# Patient Record
Sex: Male | Born: 1970
Health system: Southern US, Community
[De-identification: ages and names within clinical notes are randomized; demographics above are authoritative.]

## PROBLEM LIST (undated history)

## (undated) DIAGNOSIS — F329 Major depressive disorder, single episode, unspecified: Secondary | ICD-10-CM

## (undated) DIAGNOSIS — F32A Depression, unspecified: Secondary | ICD-10-CM

## (undated) DIAGNOSIS — Z98811 Dental restoration status: Secondary | ICD-10-CM

## (undated) DIAGNOSIS — I10 Essential (primary) hypertension: Secondary | ICD-10-CM

## (undated) DIAGNOSIS — M1990 Unspecified osteoarthritis, unspecified site: Secondary | ICD-10-CM

## (undated) DIAGNOSIS — T8859XA Other complications of anesthesia, initial encounter: Secondary | ICD-10-CM

## (undated) DIAGNOSIS — A4902 Methicillin resistant Staphylococcus aureus infection, unspecified site: Secondary | ICD-10-CM

## (undated) DIAGNOSIS — R0602 Shortness of breath: Secondary | ICD-10-CM

## (undated) DIAGNOSIS — E7800 Pure hypercholesterolemia, unspecified: Secondary | ICD-10-CM

## (undated) DIAGNOSIS — T4145XA Adverse effect of unspecified anesthetic, initial encounter: Secondary | ICD-10-CM

## (undated) DIAGNOSIS — F419 Anxiety disorder, unspecified: Secondary | ICD-10-CM

## (undated) DIAGNOSIS — G4730 Sleep apnea, unspecified: Secondary | ICD-10-CM

## (undated) HISTORY — PX: NO PAST SURGERIES: SHX2092

---
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
## 2003-06-05 ENCOUNTER — Encounter: Admission: RE | Admit: 2003-06-05 | Discharge: 2003-06-05 | Payer: Self-pay | Admitting: Internal Medicine

## 2006-03-23 ENCOUNTER — Emergency Department: Payer: Self-pay | Admitting: Emergency Medicine

## 2008-06-08 DIAGNOSIS — I10 Essential (primary) hypertension: Secondary | ICD-10-CM | POA: Insufficient documentation

## 2008-06-08 DIAGNOSIS — J449 Chronic obstructive pulmonary disease, unspecified: Secondary | ICD-10-CM | POA: Insufficient documentation

## 2011-05-30 ENCOUNTER — Other Ambulatory Visit: Payer: Self-pay | Admitting: Internal Medicine

## 2011-05-30 DIAGNOSIS — E230 Hypopituitarism: Secondary | ICD-10-CM

## 2011-06-11 ENCOUNTER — Ambulatory Visit
Admission: RE | Admit: 2011-06-11 | Discharge: 2011-06-11 | Disposition: A | Discharge status: Home / Self Care | Payer: 59 | Source: Ambulatory Visit | Attending: Internal Medicine | Admitting: Internal Medicine

## 2011-06-11 DIAGNOSIS — E230 Hypopituitarism: Secondary | ICD-10-CM

## 2011-06-11 MED ORDER — GADOBENATE DIMEGLUMINE 529 MG/ML IV SOLN
10.0000 mL | Freq: Once | INTRAVENOUS | Status: AC | PRN
  Administered 2011-06-11: 10 mL via INTRAVENOUS

## 2013-06-16 ENCOUNTER — Other Ambulatory Visit: Payer: Self-pay | Admitting: Internal Medicine

## 2013-06-16 ENCOUNTER — Encounter (HOSPITAL_COMMUNITY): Payer: Self-pay | Admitting: Emergency Medicine

## 2013-06-16 DIAGNOSIS — Z79899 Other long term (current) drug therapy: Secondary | ICD-10-CM | POA: Insufficient documentation

## 2013-06-16 DIAGNOSIS — Z791 Long term (current) use of non-steroidal anti-inflammatories (NSAID): Secondary | ICD-10-CM | POA: Insufficient documentation

## 2013-06-16 DIAGNOSIS — K5900 Constipation, unspecified: Secondary | ICD-10-CM | POA: Insufficient documentation

## 2013-06-16 DIAGNOSIS — R109 Unspecified abdominal pain: Secondary | ICD-10-CM

## 2013-06-16 DIAGNOSIS — J209 Acute bronchitis, unspecified: Secondary | ICD-10-CM | POA: Insufficient documentation

## 2013-06-16 DIAGNOSIS — R140 Abdominal distension (gaseous): Secondary | ICD-10-CM

## 2013-06-16 NOTE — ED Notes (Signed)
Patient arrives with complaint of inability to have a bowel movement for about a week. Has seen PCP and has CT scan ordered for next week. Patient explains that abdominal pain has been increasing and he is worried something more may be wrong.

## 2013-06-17 ENCOUNTER — Encounter (HOSPITAL_COMMUNITY): Payer: Self-pay | Admitting: Radiology

## 2013-06-17 ENCOUNTER — Emergency Department (HOSPITAL_COMMUNITY)
Admission: EM | Admit: 2013-06-17 | Discharge: 2013-06-17 | Disposition: A | Discharge status: Home / Self Care | Payer: BC Managed Care – PPO | Attending: Emergency Medicine | Admitting: Emergency Medicine

## 2013-06-17 ENCOUNTER — Emergency Department (HOSPITAL_COMMUNITY): Payer: BC Managed Care – PPO

## 2013-06-17 DIAGNOSIS — R109 Unspecified abdominal pain: Secondary | ICD-10-CM

## 2013-06-17 DIAGNOSIS — K5900 Constipation, unspecified: Secondary | ICD-10-CM

## 2013-06-17 LAB — COMPREHENSIVE METABOLIC PANEL
ALT: 30 U/L (ref 0–53)
AST: 29 U/L (ref 0–37)
Albumin: 3.9 g/dL (ref 3.5–5.2)
Alkaline Phosphatase: 67 U/L (ref 39–117)
BUN: 14 mg/dL (ref 6–23)
CO2: 23 mEq/L (ref 19–32)
Calcium: 9.3 mg/dL (ref 8.4–10.5)
Chloride: 100 mEq/L (ref 96–112)
Creatinine, Ser: 1.18 mg/dL (ref 0.50–1.35)
GFR calc Af Amer: 85 mL/min — ABNORMAL LOW (ref >90)
GFR calc non Af Amer: 73 mL/min — ABNORMAL LOW (ref >90)
Glucose, Bld: 90 mg/dL (ref 70–99)
Potassium: 4.2 mEq/L (ref 3.7–5.3)
Sodium: 139 mEq/L (ref 137–147)
Total Bilirubin: 0.6 mg/dL (ref 0.3–1.2)
Total Protein: 7.5 g/dL (ref 6.0–8.3)

## 2013-06-17 LAB — URINALYSIS, ROUTINE W REFLEX MICROSCOPIC
Bilirubin Urine: NEGATIVE
Glucose, UA: NEGATIVE mg/dL
Hgb urine dipstick: NEGATIVE
Ketones, ur: 15 mg/dL — AB
Leukocytes, UA: NEGATIVE
Nitrite: NEGATIVE
Protein, ur: NEGATIVE mg/dL
Specific Gravity, Urine: 1.028 (ref 1.005–1.030)
Urobilinogen, UA: 0.2 mg/dL (ref 0.0–1.0)
pH: 5.5 (ref 5.0–8.0)

## 2013-06-17 LAB — CBC WITH DIFFERENTIAL/PLATELET
Basophils Absolute: 0 10*3/uL (ref 0.0–0.1)
Basophils Relative: 0 % (ref 0–1)
Eosinophils Absolute: 0.3 10*3/uL (ref 0.0–0.7)
Eosinophils Relative: 5 % (ref 0–5)
HCT: 47.7 % (ref 39.0–52.0)
Hemoglobin: 17.1 g/dL — ABNORMAL HIGH (ref 13.0–17.0)
Lymphocytes Relative: 39 % (ref 12–46)
Lymphs Abs: 2.2 10*3/uL (ref 0.7–4.0)
MCH: 32.2 pg (ref 26.0–34.0)
MCHC: 35.8 g/dL (ref 30.0–36.0)
MCV: 89.8 fL (ref 78.0–100.0)
Monocytes Absolute: 0.9 10*3/uL (ref 0.1–1.0)
Monocytes Relative: 15 % — ABNORMAL HIGH (ref 3–12)
Neutro Abs: 2.3 10*3/uL (ref 1.7–7.7)
Neutrophils Relative %: 41 % — ABNORMAL LOW (ref 43–77)
Platelets: 135 10*3/uL — ABNORMAL LOW (ref 150–400)
RBC: 5.31 MIL/uL (ref 4.22–5.81)
RDW: 13.1 % (ref 11.5–15.5)
WBC: 5.7 10*3/uL (ref 4.0–10.5)

## 2013-06-17 LAB — LIPASE, BLOOD: Lipase: 42 U/L (ref 11–59)

## 2013-06-17 MED ORDER — IOHEXOL 300 MG/ML  SOLN: 25.0000 mL | Freq: Once | INTRAMUSCULAR | Status: DC | PRN

## 2013-06-17 MED ORDER — IOHEXOL 300 MG/ML  SOLN
100.0000 mL | Freq: Once | INTRAMUSCULAR | Status: AC | PRN
  Administered 2013-06-17: 100 mL via INTRAVENOUS

## 2013-06-17 MED ORDER — HYDROCODONE-ACETAMINOPHEN 5-325 MG PO TABS: 1.0000 | ORAL_TABLET | ORAL | Status: DC | PRN

## 2013-06-17 MED ORDER — DOCUSATE SODIUM 100 MG PO CAPS: 100.0000 mg | ORAL_CAPSULE | Freq: Two times a day (BID) | ORAL | Status: DC

## 2013-06-17 MED ORDER — NAPROXEN 500 MG PO TABS: 500.0000 mg | ORAL_TABLET | Freq: Two times a day (BID) | ORAL | Status: DC

## 2013-06-17 MED ORDER — MORPHINE SULFATE 4 MG/ML IJ SOLN
4.0000 mg | Freq: Once | INTRAMUSCULAR | Status: AC
  Administered 2013-06-17: 4 mg via INTRAVENOUS
  Filled 2013-06-17: qty 1

## 2013-06-17 NOTE — ED Notes (Signed)
Ct notified that pt is finished drinking contrast.  

## 2013-06-17 NOTE — Discharge Instructions (Signed)
Abdominal Pain, Adult °Many things can cause abdominal pain. Usually, abdominal pain is not caused by a disease and will improve without treatment. It can often be observed and treated at home. Your health care provider will do a physical exam and possibly order blood tests and X-rays to help determine the seriousness of your pain. However, in many cases, more time must pass before a clear cause of the pain can be found. Before that point, your health care provider may not know if you need more testing or further treatment. °HOME CARE INSTRUCTIONS  °Monitor your abdominal pain for any changes. The following actions may help to alleviate any discomfort you are experiencing: °· Only take over-the-counter or prescription medicines as directed by your health care provider. °· Do not take laxatives unless directed to do so by your health care provider. °· Try a clear liquid diet (broth, tea, or water) as directed by your health care provider. Slowly move to a bland diet as tolerated. °SEEK MEDICAL CARE IF: °· You have unexplained abdominal pain. °· You have abdominal pain associated with nausea or diarrhea. °· You have pain when you urinate or have a bowel movement. °· You experience abdominal pain that wakes you in the night. °· You have abdominal pain that is worsened or improved by eating food. °· You have abdominal pain that is worsened with eating fatty foods. °SEEK IMMEDIATE MEDICAL CARE IF:  °· Your pain does not go away within 2 hours. °· You have a fever. °· You keep throwing up (vomiting). °· Your pain is felt only in portions of the abdomen, such as the right side or the left lower portion of the abdomen. °· You pass bloody or black tarry stools. °MAKE SURE YOU: °· Understand these instructions.   °· Will watch your condition.   °· Will get help right away if you are not doing well or get worse.   °Document Released: 12/11/2004 Document Revised: 12/22/2012 Document Reviewed: 11/10/2012 °Exit[employer]® Patient  Information ©2014 [employer]. ° °Constipation, Adult °Constipation is when a person: °· Poops (bowel movement) less than 3 times a week. °· Has a hard time pooping. °· Has poop that is dry, hard, or bigger than normal. °HOME CARE  °· Eat more fiber, such as fruits, vegetables, whole grains like brown rice, and beans. °· Eat less fatty foods and sugar. This includes French fries, hamburgers, cookies, candy, and soda. °· If you are not getting enough fiber from food, take products with added fiber in them (supplements). °· Drink enough fluid to keep your pee (urine) clear or pale yellow. °· Go to the restroom when you feel like you need to poop. Do not hold it. °· Only take medicine as told by your doctor. Do not take medicines that help you poop (laxatives) without talking to your doctor first. °· Exercise on a regular basis, or as told by your doctor. °GET HELP RIGHT AWAY IF:  °· You have bright red blood in your poop (stool). °· Your constipation lasts more than 4 days or gets worse. °· You have belly (abdomen) or butt (rectal) pain. °· You have thin poop (as thin as a pencil). °· You lose weight, and it cannot be explained. °MAKE SURE YOU:  °· Understand these instructions. °· Will watch your condition. °· Will get help right away if you are not doing well or get worse. °Document Released: 08/20/2007 Document Revised: 05/26/2011 Document Reviewed: 12/13/2012 °ExitCare® Patient Information ©2014 [employer]. ° °

## 2013-06-17 NOTE — ED Provider Notes (Signed)
CSN: [id number]     Arrival date & time 06/16/13  2103 History   First MD Initiated Contact with Patient 06/17/13 0253     Chief Complaint  Patient presents with  . Abdominal Pain  . Constipation     (Consider location/radiation/quality/duration/timing/severity/associated sxs/prior Treatment) HPI Comments: 43 yo wm with h/o 8 weeks of abd pain and constipation.  No BM for 7 days.   Pt was seen by PCP last week and an outpatient CT was ordered and scheduled for Monday (3 days from now).  No n/v, f/c, f/u/d, hematuria, CP, SOB, no trauma.  Pt is on azithromycin for bronchitis.   Pt tried miralax for 5 days and colon cleanse x 4 days.    Patient is a 43 y.o. male presenting with abdominal pain and constipation. The history is provided by the patient and the spouse.  Abdominal Pain Pain location:  RUQ Pain quality: aching and dull   Pain quality: not bloating, not burning, not cramping, no fullness, not gnawing, not heavy, no pressure, not sharp, not shooting, not squeezing and not stabbing   Pain quality comment:  8 weeks Pain radiates to:  Does not radiate Pain severity:  Moderate Onset quality:  Gradual Duration:  8 weeks Timing:  Constant Chronicity:  New Relieved by:  None tried Worsened by:  Movement Ineffective treatments:  None tried Associated symptoms: constipation   Associated symptoms: no anorexia, no belching, no chest pain, no chills, no cough, no diarrhea, no dysuria, no fatigue, no fever, no flatus, no hematemesis, no hematochezia, no hematuria, no melena, no nausea, no shortness of breath, no sore throat and no vomiting   Constipation Associated symptoms: abdominal pain   Associated symptoms: no anorexia, no diarrhea, no dysuria, no fever, no flatus, no hematochezia, no nausea and no vomiting     History reviewed. No pertinent past medical history. History reviewed. No pertinent past surgical history. History reviewed. No pertinent family history. History    Substance Use Topics  . Smoking status: Never Smoker   . Smokeless tobacco: Not on file  . Alcohol Use: No    Review of Systems  Constitutional: Negative for fever, chills, activity change, appetite change and fatigue.  HENT: Negative.  Negative for sore throat.   Eyes: Negative.   Respiratory: Negative.  Negative for apnea, cough, choking, chest tightness, shortness of breath, wheezing and stridor.        Being treated for bronchitis day 3.    Cardiovascular: Negative for chest pain, palpitations and leg swelling.  Gastrointestinal: Positive for abdominal pain and constipation. Negative for nausea, vomiting, diarrhea, blood in stool, melena, hematochezia, rectal pain, anorexia, flatus and hematemesis.  Endocrine: Negative.   Genitourinary: Negative for dysuria, frequency, hematuria, flank pain, discharge, enuresis, genital sores and penile pain.  Musculoskeletal: Negative.   Allergic/Immunologic: Negative.   Neurological: Negative.   Hematological: Negative.       Allergies  Neosporin  Home Medications   Current Outpatient Rx  Name  Route  Sig  Dispense  Refill  . azithromycin (ZITHROMAX Z-PAK) 250 MG tablet   Oral   Take 250 mg by mouth See admin instructions. Patient states he has two more pill to take tomorrow. Finished with course..         . PARoxetine (PAXIL) 40 MG tablet   Oral   Take 40 mg by mouth every morning.         . rosuvastatin (CRESTOR) 10 MG tablet   Oral   Take 10  mg by mouth daily.         . valsartan-hydrochlorothiazide (DIOVAN-HCT) 160-12.5 MG per tablet   Oral   Take 1 tablet by mouth daily.         Marland Kitchen docusate sodium (COLACE) 100 MG capsule   Oral   Take 1 capsule (100 mg total) by mouth every 12 (twelve) hours.   60 capsule   0   . HYDROcodone-acetaminophen (NORCO/VICODIN) 5-325 MG per tablet   Oral   Take 1 tablet by mouth every 4 (four) hours as needed.   30 tablet   0   . naproxen (NAPROSYN) 500 MG tablet   Oral   Take  1 tablet (500 mg total) by mouth 2 (two) times daily.   30 tablet   0    BP 121/64  Pulse 71  Temp(Src) 97.5 F (36.4 C) (Oral)  Resp 18  Ht 5\' 9"  (1.753 m)  Wt 230 lb (104.327 kg)  BMI 33.95 kg/m2  SpO2 97% Physical Exam  Nursing note and vitals reviewed. Constitutional: He appears well-developed and well-nourished. No distress.  HENT:  Head: Normocephalic and atraumatic.  Mouth/Throat: Oropharynx is clear and moist.  Eyes: Conjunctivae are normal. Right eye exhibits no discharge. Left eye exhibits no discharge.  Neck: Normal range of motion. Neck supple. No JVD present. No tracheal deviation present.  Cardiovascular: Normal rate and regular rhythm.   Pulmonary/Chest: Effort normal and breath sounds normal. No stridor.  Abdominal: Soft. Bowel sounds are normal. He exhibits no distension and no mass. There is no hepatosplenomegaly. There is tenderness. There is no rigidity, no rebound, no guarding, no CVA tenderness, no tenderness at McBurney's point and negative Murphy's sign. No hernia. Hernia confirmed negative in the right inguinal area and confirmed negative in the left inguinal area.    TTP to Right lower ribs, Negative Murphy's, Neg TTP to McBurney's, neg CVAT,   Genitourinary: Testes normal and penis normal. Cremasteric reflex is present. Right testis shows no mass, no swelling and no tenderness. Left testis shows no mass, no swelling and no tenderness. Circumcised. No penile tenderness.  Musculoskeletal: Normal range of motion. He exhibits no edema and no tenderness.  Neurological: He is alert. He has normal reflexes. No cranial nerve deficit.  Skin: He is not diaphoretic.    ED Course  Procedures (including critical care time) Labs Review Labs Reviewed  CBC WITH DIFFERENTIAL - Abnormal; Notable for the following:    Hemoglobin 17.1 (*)    Platelets 135 (*)    Neutrophils Relative % 41 (*)    Monocytes Relative 15 (*)    All other components within normal limits    COMPREHENSIVE METABOLIC PANEL - Abnormal; Notable for the following:    GFR calc non Af Amer 73 (*)    GFR calc Af Amer 85 (*)    All other components within normal limits  URINALYSIS, ROUTINE W REFLEX MICROSCOPIC - Abnormal; Notable for the following:    APPearance CLOUDY (*)    Ketones, ur 15 (*)    All other components within normal limits  LIPASE, BLOOD   Imaging Review Dg Chest 2 View  06/17/2013   CLINICAL DATA:  Right chest pain and abdominal pain for 1 week.  EXAM: CHEST  2 VIEW  COMPARISON:  DG CHEST 2 VIEW dated 06/05/2003  FINDINGS: Cardiomediastinal silhouette is unremarkable. The lungs are clear without pleural effusions or focal consolidations. Trachea projects midline and there is no pneumothorax. Soft tissue planes and included osseous structures  are non-suspicious.  IMPRESSION: No acute cardiopulmonary process.   Electronically Signed   By: Awilda Metroourtnay  Bloomer   On: 06/17/2013 03:57   Ct Abdomen Pelvis W Contrast  06/17/2013   CLINICAL DATA:  Worsening abdominal pain.  EXAM: CT ABDOMEN AND PELVIS WITH CONTRAST  TECHNIQUE: Multidetector CT imaging of the abdomen and pelvis was performed using the standard protocol following bolus administration of intravenous contrast.  CONTRAST:  100mL OMNIPAQUE IOHEXOL 300 MG/ML  SOLN  COMPARISON:  None.  FINDINGS: The visualized lung bases are clear.  The liver and spleen are unremarkable in appearance. The gallbladder is within normal limits. The pancreas and adrenal glands are unremarkable.  The kidneys are unremarkable in appearance. There is no evidence of hydronephrosis. No renal or ureteral stones are seen. No perinephric stranding is appreciated.  No free fluid is identified. The small bowel is unremarkable in appearance. The stomach is within normal limits. No acute vascular abnormalities are seen.  The appendix is normal in caliber and contains air, without evidence for appendicitis. The colon is unremarkable in appearance.  The bladder is  mildly distended and grossly unremarkable. The prostate remains normal in size. No inguinal lymphadenopathy is seen.  No acute osseous abnormalities are identified. A chronic left-sided pars defect is noted at L5.  IMPRESSION: 1. No acute abnormality seen within the abdomen or pelvis. 2. Chronic left-sided pars defect noted at L5.   Electronically Signed   By: Roanna RaiderJeffery  Chang M.D.   On: 06/17/2013 04:32     EKG Interpretation None      MDM   Final diagnoses:  Abdominal pain  Constipation   Darryl Chaney is a 43 yo wm with cc of R rib/abd pain and constipation.  Pt is in NAD.  AFVSS.  He was scheduled for an outpt CT on Monday (4 days from now) but states his symptoms got to severe.    Plan for labs, CT, CXR, and treatment of symptoms.  ER course: UA - negative CBC - 5.09/30/45/135 CMP - normal except for GFR 73 CT Abd/pelvis - Neg for acute pathology CXR - NACPD  ER w/u negative.  Pain improved after pain Rx.  I had a lengthy d/w the pt and his wife regarding the need for PCP follow up.  His pain seems to be rib pain.  Plan for narcotics and colace and pt is to continue miralax.  I discussed the very unlikely chance of a PE, but based on pts vitals, lack of respiratory symptoms and he is PERC negative a joint decision to hold on testing was made.  Strict ER return precautions were given.  Pt will f/u with PCP who he sees regularly.  Pt and his wife agree with plan.       Darlys Galesavid Keyle Doby, MD 06/17/13 [phone]

## 2013-06-17 NOTE — ED Notes (Signed)
Pt currently in ct

## 2013-06-17 NOTE — ED Notes (Signed)
Patient transported to X-ray 

## 2013-06-20 ENCOUNTER — Other Ambulatory Visit: Payer: 59

## 2013-11-24 ENCOUNTER — Encounter (HOSPITAL_COMMUNITY): Payer: Self-pay | Admitting: Emergency Medicine

## 2013-11-24 ENCOUNTER — Emergency Department (HOSPITAL_COMMUNITY)
Admission: EM | Admit: 2013-11-24 | Discharge: 2013-11-24 | Discharge status: Against Medical Advice | Payer: BC Managed Care – PPO | Attending: Emergency Medicine | Admitting: Emergency Medicine

## 2013-11-24 DIAGNOSIS — R109 Unspecified abdominal pain: Secondary | ICD-10-CM | POA: Diagnosis present

## 2013-11-24 DIAGNOSIS — R509 Fever, unspecified: Secondary | ICD-10-CM | POA: Diagnosis not present

## 2013-11-24 NOTE — ED Notes (Signed)
Pt c/p bilateral groin pain x 4 days and fever states it started today. Pt has 3 abscess around groin. States they think it may be MRSA.

## 2013-11-26 ENCOUNTER — Encounter (HOSPITAL_COMMUNITY): Payer: Self-pay | Admitting: Emergency Medicine

## 2013-11-26 ENCOUNTER — Emergency Department (HOSPITAL_COMMUNITY)
Admission: EM | Admit: 2013-11-26 | Discharge: 2013-11-26 | Disposition: A | Discharge status: Home / Self Care | Payer: BC Managed Care – PPO | Attending: Emergency Medicine | Admitting: Emergency Medicine

## 2013-11-26 DIAGNOSIS — Z79899 Other long term (current) drug therapy: Secondary | ICD-10-CM | POA: Diagnosis not present

## 2013-11-26 DIAGNOSIS — N498 Inflammatory disorders of other specified male genital organs: Secondary | ICD-10-CM | POA: Insufficient documentation

## 2013-11-26 DIAGNOSIS — N492 Inflammatory disorders of scrotum: Secondary | ICD-10-CM

## 2013-11-26 MED ORDER — MORPHINE SULFATE 4 MG/ML IJ SOLN
8.0000 mg | INTRAMUSCULAR | Status: AC
  Administered 2013-11-26: 8 mg via INTRAMUSCULAR
  Filled 2013-11-26: qty 2

## 2013-11-26 MED ORDER — MORPHINE SULFATE 4 MG/ML IJ SOLN: 8.0000 mg | Freq: Once | INTRAMUSCULAR | Status: DC

## 2013-11-26 MED ORDER — MORPHINE SULFATE 4 MG/ML IJ SOLN: 4.0000 mg | Freq: Once | INTRAMUSCULAR | Status: DC

## 2013-11-26 MED ORDER — ONDANSETRON 4 MG PO TBDP
4.0000 mg | ORAL_TABLET | Freq: Once | ORAL | Status: AC
  Administered 2013-11-26: 4 mg via ORAL
  Filled 2013-11-26: qty 1

## 2013-11-26 MED ORDER — KETOROLAC TROMETHAMINE 30 MG/ML IJ SOLN
30.0000 mg | Freq: Once | INTRAMUSCULAR | Status: AC
  Administered 2013-11-26: 30 mg via INTRAMUSCULAR
  Filled 2013-11-26: qty 1

## 2013-11-26 MED ORDER — LIDOCAINE-EPINEPHRINE (PF) 2 %-1:200000 IJ SOLN
10.0000 mL | Freq: Once | INTRAMUSCULAR | Status: AC
  Administered 2013-11-26: 10 mL
  Filled 2013-11-26: qty 10

## 2013-11-26 NOTE — ED Provider Notes (Signed)
Medical screening examination/treatment/procedure(s) were conducted as a shared visit with non-physician practitioner(s) and myself.  I personally evaluated the patient during the encounter.   EKG Interpretation None     43yM with abscess base of scrotum. Couple other areas on scrotum consistent with prior/resolving abscess. Most likely 2/2 shaving.  Graciously seen by his urologist, Dr Marlou Porch in ED. Amenable to bedside I&D. Just prescribed abx/pain meds. Continue as instructed. Continued wound care, sitz baths, return precautions, etc discussed. FU with Dr Marlou Porch as previously arranged.   Raeford Razor, MD 11/26/13 [phone]

## 2013-11-26 NOTE — ED Notes (Signed)
Urology at bedside.

## 2013-11-26 NOTE — ED Provider Notes (Signed)
CSN: [id number]     Arrival date & time 11/26/13  0831 History   First MD Initiated Contact with Patient 11/26/13 0845     Chief Complaint  Patient presents with  . Abcess     HPI Darryl Chaney is a 43 y.o. male presenting with scrotal abscess for 4 days. Abscess is at base of scrotum. Patient was seen by urology yesterday and pt refused lancing and prescribe abx but the abscess is persistent, painful. Fever 101.6 last night and took ibuprofen. No problems with urination. Patient shaves scrotum and has had scrotal abscesses before. No fevers, nausea, vomiting, red streaks from area. Patient has other scrotal abscess that are draining.   History reviewed. No pertinent past medical history. History reviewed. No pertinent past surgical history. No family history on file. History  Substance Use Topics  . Smoking status: Never Smoker   . Smokeless tobacco: Not on file  . Alcohol Use: No    Review of Systems  Constitutional: Positive for fever. Negative for chills.  Musculoskeletal: Negative for joint swelling.  Skin: Positive for wound.  Neurological: Negative for weakness and numbness.      Allergies  Neosporin  Home Medications   Prior to Admission medications   Medication Sig Start Date End Date Taking? Authorizing Provider  ibuprofen (ADVIL,MOTRIN) 200 MG tablet Take 800 mg by mouth every 6 (six) hours as needed for moderate pain.   Yes Historical Provider, MD  PARoxetine (PAXIL) 40 MG tablet Take 40 mg by mouth every morning.   Yes Historical Provider, MD  rosuvastatin (CRESTOR) 10 MG tablet Take 10 mg by mouth daily.   Yes Historical Provider, MD  valsartan-hydrochlorothiazide (DIOVAN-HCT) 160-12.5 MG per tablet Take 1 tablet by mouth daily.   Yes Historical Provider, MD   BP 115/64  Pulse 84  Temp(Src) 98.9 F (37.2 C) (Oral)  Resp 18  SpO2 94% Physical Exam  Nursing note and vitals reviewed. Constitutional: He appears well-developed and well-nourished.  HENT:   Head: Normocephalic and atraumatic.  Eyes: Conjunctivae are normal. Right eye exhibits no discharge. Left eye exhibits no discharge.  Pulmonary/Chest: Effort normal.  Musculoskeletal: Normal range of motion.  Neurological: He is alert. He exhibits normal muscle tone.  Skin: Skin is warm and dry.  5cm by 2-3cm abscess on base of scrotum with expected surrounding induration and erythema.  2x3cm lesion on anterior scrotum, spontaneously drainaged without area of fluctuance with expected induration  Psychiatric: He has a normal mood and affect. His behavior is normal.    ED Course  Procedures (including critical care time) Labs Review Labs Reviewed - No data to display  Imaging Review No results found.   EKG Interpretation None     INCISION AND DRAINAGE Performed by: Louann Sjogren Consent: Verbal consent obtained. Risks and benefits: risks, benefits and alternatives were discussed Type: abscess  Body area: posterior scrotum  Anesthesia: local infiltration  Incision was made with a scalpel.  Local anesthetic: lidocaine 2% with epinephrine  Anesthetic total: 5 ml  Complexity: complex Blunt dissection to break up loculations  Drainage: purulent  Drainage amount: copious, 20ml  Packing material: none  Patient tolerance: Patient tolerated the procedure well with no immediate complications.    Meds given in ED:  Medications  lidocaine-EPINEPHrine (XYLOCAINE W/EPI) 2 %-1:200000 (PF) injection 10 mL (10 mLs Infiltration Given by Other 11/26/13 1007)  ketorolac (TORADOL) 30 MG/ML injection 30 mg (30 mg Intramuscular Given 11/26/13 0911)  morphine 4 MG/ML injection 8 mg (8  mg Intramuscular Given 11/26/13 0911)  ondansetron (ZOFRAN-ODT) disintegrating tablet 4 mg (4 mg Oral Given 11/26/13 0910)    Discharge Medication List as of 11/26/2013  9:53 AM        MDM   Final diagnoses:  Scrotal abscess   Patient with skin abscess amenable to incision and drainage.   Abscess was not large enough to warrant packing or drain,  wound recheck with Urology. Appointment already scheduled. Encouraged home warm soaks and flushing.   Will d/c to home.  Take antibiotic therapy and pain meds as prescribed. Abscess most likely due to scrotal shaving and patient encouraged to stop.   Discussed return precautions with patient. Discussed all results and patient verbalizes understanding and agrees with plan.   Louann Sjogren, PA-C 11/26/13 1122

## 2013-11-26 NOTE — ED Notes (Addendum)
Pt reports being seen at Urology office yesterday for groin abcess. Refused recommended lancing yesterday, called MD last night and was told to come here this am and have ED page Dr. Marlou Porch to come and he would perform procedure in ED. Fever at home up to 101.6,  ibuprofen at home, last dose last night.

## 2013-11-26 NOTE — Discharge Instructions (Signed)
Return to the emergency room with worsening of symptoms, new symptoms or with symptoms that are concerning, especially fevers, red streaks, increased pain, swelling and redness.  Follow up with Urologist with schedule appointment. Continue to take antibiotics and pain control. Do warm soaks.  Keep gauze in place and wear underwear to help keep gauze in place. It will most likely continue to drain. May wash with mild soap.   Abscess An abscess is an infected area that contains a collection of pus and debris.It can occur in almost any part of the body. An abscess is also known as a furuncle or boil. CAUSES  An abscess occurs when tissue gets infected. This can occur from blockage of oil or sweat glands, infection of hair follicles, or a minor injury to the skin. As the body tries to fight the infection, pus collects in the area and creates pressure under the skin. This pressure causes pain. People with weakened immune systems have difficulty fighting infections and get certain abscesses more often.  SYMPTOMS Usually an abscess develops on the skin and becomes a painful mass that is red, warm, and tender. If the abscess forms under the skin, you may feel a moveable soft area under the skin. Some abscesses break open (rupture) on their own, but most will continue to get worse without care. The infection can spread deeper into the body and eventually into the bloodstream, causing you to feel ill.  DIAGNOSIS  Your caregiver will take your medical history and perform a physical exam. A sample of fluid may also be taken from the abscess to determine what is causing your infection. TREATMENT  Your caregiver may prescribe antibiotic medicines to fight the infection. However, taking antibiotics alone usually does not cure an abscess. Your caregiver may need to make a small cut (incision) in the abscess to drain the pus. In some cases, gauze is packed into the abscess to reduce pain and to continue draining the  area. HOME CARE INSTRUCTIONS   Only take over-the-counter or prescription medicines for pain, discomfort, or fever as directed by your caregiver.  If you were prescribed antibiotics, take them as directed. Finish them even if you start to feel better.  If gauze is used, follow your caregiver's directions for changing the gauze.  To avoid spreading the infection:  Keep your draining abscess covered with a bandage.  Wash your hands well.  Do not share personal care items, towels, or whirlpools with others.  Avoid skin contact with others.  Keep your skin and clothes clean around the abscess.  Keep all follow-up appointments as directed by your caregiver. SEEK MEDICAL CARE IF:   You have increased pain, swelling, redness, fluid drainage, or bleeding.  You have muscle aches, chills, or a general ill feeling.  You have a fever. MAKE SURE YOU:   Understand these instructions.  Will watch your condition.  Will get help right away if you are not doing well or get worse. Document Released: 12/11/2004 Document Revised: 09/02/2011 Document Reviewed: 05/16/2011 [facility] Patient Information 2015 [place], [place]. This information is not intended to replace advice given to you by your health care provider. Make sure you discuss any questions you have with your health care provider.

## 2013-11-30 NOTE — ED Provider Notes (Signed)
Medical screening examination/treatment/procedure(s) were conducted as a shared visit with non-physician practitioner(s) and myself.  I personally evaluated the patient during the encounter.   EKG Interpretation None       Raeford Razor, MD 11/30/13 1158

## 2015-10-03 ENCOUNTER — Other Ambulatory Visit: Payer: Self-pay | Admitting: Internal Medicine

## 2015-10-03 DIAGNOSIS — N6320 Unspecified lump in the left breast, unspecified quadrant: Secondary | ICD-10-CM

## 2015-10-09 ENCOUNTER — Other Ambulatory Visit: Payer: Self-pay | Admitting: Internal Medicine

## 2015-10-09 DIAGNOSIS — N6320 Unspecified lump in the left breast, unspecified quadrant: Secondary | ICD-10-CM

## 2015-10-12 ENCOUNTER — Ambulatory Visit
Admission: RE | Admit: 2015-10-12 | Discharge: 2015-10-12 | Disposition: A | Discharge status: Home / Self Care | Payer: BLUE CROSS/BLUE SHIELD | Source: Ambulatory Visit | Attending: Internal Medicine | Admitting: Internal Medicine

## 2015-10-12 ENCOUNTER — Other Ambulatory Visit: Payer: Self-pay | Admitting: Internal Medicine

## 2015-10-12 DIAGNOSIS — N6320 Unspecified lump in the left breast, unspecified quadrant: Secondary | ICD-10-CM

## 2015-10-15 ENCOUNTER — Other Ambulatory Visit: Payer: Self-pay | Admitting: Internal Medicine

## 2015-10-15 ENCOUNTER — Inpatient Hospital Stay: Admission: RE | Admit: 2015-10-15 | Payer: BLUE CROSS/BLUE SHIELD | Source: Ambulatory Visit

## 2015-10-15 DIAGNOSIS — N6320 Unspecified lump in the left breast, unspecified quadrant: Secondary | ICD-10-CM

## 2015-11-13 ENCOUNTER — Other Ambulatory Visit: Payer: Self-pay | Admitting: Internal Medicine

## 2015-11-13 ENCOUNTER — Ambulatory Visit
Admission: RE | Admit: 2015-11-13 | Discharge: 2015-11-13 | Disposition: A | Discharge status: Home / Self Care | Payer: BLUE CROSS/BLUE SHIELD | Source: Ambulatory Visit | Attending: Internal Medicine | Admitting: Internal Medicine

## 2015-11-13 DIAGNOSIS — R05 Cough: Secondary | ICD-10-CM

## 2015-11-13 DIAGNOSIS — R059 Cough, unspecified: Secondary | ICD-10-CM

## 2015-12-04 ENCOUNTER — Inpatient Hospital Stay: Admission: RE | Admit: 2015-12-04 | Payer: BLUE CROSS/BLUE SHIELD | Source: Ambulatory Visit

## 2015-12-25 ENCOUNTER — Inpatient Hospital Stay: Admission: RE | Admit: 2015-12-25 | Payer: BLUE CROSS/BLUE SHIELD | Source: Ambulatory Visit

## 2017-10-12 DIAGNOSIS — Z0000 Encounter for general adult medical examination without abnormal findings: Secondary | ICD-10-CM | POA: Diagnosis not present

## 2017-10-12 DIAGNOSIS — I10 Essential (primary) hypertension: Secondary | ICD-10-CM | POA: Diagnosis not present

## 2017-10-12 DIAGNOSIS — Z23 Encounter for immunization: Secondary | ICD-10-CM | POA: Diagnosis not present

## 2017-10-12 DIAGNOSIS — J329 Chronic sinusitis, unspecified: Secondary | ICD-10-CM | POA: Diagnosis not present

## 2017-10-12 DIAGNOSIS — N183 Chronic kidney disease, stage 3 (moderate): Secondary | ICD-10-CM | POA: Diagnosis not present

## 2017-10-12 DIAGNOSIS — E230 Hypopituitarism: Secondary | ICD-10-CM | POA: Diagnosis not present

## 2017-10-12 DIAGNOSIS — Z79899 Other long term (current) drug therapy: Secondary | ICD-10-CM | POA: Diagnosis not present

## 2017-10-12 DIAGNOSIS — E785 Hyperlipidemia, unspecified: Secondary | ICD-10-CM | POA: Diagnosis not present

## 2017-10-22 DIAGNOSIS — L738 Other specified follicular disorders: Secondary | ICD-10-CM | POA: Diagnosis not present

## 2017-11-13 DIAGNOSIS — L738 Other specified follicular disorders: Secondary | ICD-10-CM | POA: Diagnosis not present

## 2017-12-21 DIAGNOSIS — M25511 Pain in right shoulder: Secondary | ICD-10-CM | POA: Diagnosis not present

## 2017-12-21 DIAGNOSIS — M9902 Segmental and somatic dysfunction of thoracic region: Secondary | ICD-10-CM | POA: Diagnosis not present

## 2017-12-21 DIAGNOSIS — M546 Pain in thoracic spine: Secondary | ICD-10-CM | POA: Diagnosis not present

## 2017-12-21 DIAGNOSIS — S46911A Strain of unspecified muscle, fascia and tendon at shoulder and upper arm level, right arm, initial encounter: Secondary | ICD-10-CM | POA: Diagnosis not present

## 2017-12-27 DIAGNOSIS — L03116 Cellulitis of left lower limb: Secondary | ICD-10-CM | POA: Diagnosis not present

## 2018-01-12 DIAGNOSIS — M25511 Pain in right shoulder: Secondary | ICD-10-CM | POA: Diagnosis not present

## 2018-01-12 DIAGNOSIS — M19011 Primary osteoarthritis, right shoulder: Secondary | ICD-10-CM | POA: Diagnosis not present

## 2018-01-12 DIAGNOSIS — M89511 Osteolysis, right shoulder: Secondary | ICD-10-CM | POA: Diagnosis not present

## 2018-01-12 DIAGNOSIS — M6158 Other ossification of muscle, other site: Secondary | ICD-10-CM | POA: Diagnosis not present

## 2018-02-16 DIAGNOSIS — M546 Pain in thoracic spine: Secondary | ICD-10-CM | POA: Diagnosis not present

## 2018-02-16 DIAGNOSIS — M9902 Segmental and somatic dysfunction of thoracic region: Secondary | ICD-10-CM | POA: Diagnosis not present

## 2018-02-16 DIAGNOSIS — M25511 Pain in right shoulder: Secondary | ICD-10-CM | POA: Diagnosis not present

## 2018-02-16 DIAGNOSIS — S46911A Strain of unspecified muscle, fascia and tendon at shoulder and upper arm level, right arm, initial encounter: Secondary | ICD-10-CM | POA: Diagnosis not present

## 2018-02-24 DIAGNOSIS — S46911A Strain of unspecified muscle, fascia and tendon at shoulder and upper arm level, right arm, initial encounter: Secondary | ICD-10-CM | POA: Diagnosis not present

## 2018-02-24 DIAGNOSIS — M9902 Segmental and somatic dysfunction of thoracic region: Secondary | ICD-10-CM | POA: Diagnosis not present

## 2018-02-24 DIAGNOSIS — M546 Pain in thoracic spine: Secondary | ICD-10-CM | POA: Diagnosis not present

## 2018-02-24 DIAGNOSIS — M25511 Pain in right shoulder: Secondary | ICD-10-CM | POA: Diagnosis not present

## 2018-03-05 DIAGNOSIS — G479 Sleep disorder, unspecified: Secondary | ICD-10-CM | POA: Diagnosis not present

## 2018-03-05 DIAGNOSIS — M7581 Other shoulder lesions, right shoulder: Secondary | ICD-10-CM | POA: Diagnosis not present

## 2018-04-13 ENCOUNTER — Other Ambulatory Visit: Payer: Self-pay | Admitting: Orthopaedic Surgery

## 2018-04-13 DIAGNOSIS — M25511 Pain in right shoulder: Secondary | ICD-10-CM | POA: Diagnosis not present

## 2018-04-13 DIAGNOSIS — M25531 Pain in right wrist: Secondary | ICD-10-CM | POA: Diagnosis not present

## 2018-04-16 ENCOUNTER — Ambulatory Visit
Admission: RE | Admit: 2018-04-16 | Discharge: 2018-04-16 | Disposition: A | Discharge status: Home / Self Care | Payer: BLUE CROSS/BLUE SHIELD | Source: Ambulatory Visit | Attending: Orthopaedic Surgery | Admitting: Orthopaedic Surgery

## 2018-04-16 DIAGNOSIS — M25511 Pain in right shoulder: Secondary | ICD-10-CM

## 2018-04-16 DIAGNOSIS — S46011A Strain of muscle(s) and tendon(s) of the rotator cuff of right shoulder, initial encounter: Secondary | ICD-10-CM | POA: Diagnosis not present

## 2018-04-16 MED ORDER — IOPAMIDOL (ISOVUE-M 200) INJECTION 41%
15.0000 mL | Freq: Once | INTRAMUSCULAR | Status: AC
  Administered 2018-04-16: 15 mL via INTRA_ARTICULAR

## 2018-04-22 DIAGNOSIS — M25511 Pain in right shoulder: Secondary | ICD-10-CM | POA: Diagnosis not present

## 2018-05-10 DIAGNOSIS — H2512 Age-related nuclear cataract, left eye: Secondary | ICD-10-CM | POA: Diagnosis not present

## 2018-05-18 ENCOUNTER — Other Ambulatory Visit: Payer: Self-pay

## 2018-05-18 ENCOUNTER — Encounter (HOSPITAL_BASED_OUTPATIENT_CLINIC_OR_DEPARTMENT_OTHER): Payer: Self-pay | Admitting: *Deleted

## 2018-05-18 DIAGNOSIS — R0681 Apnea, not elsewhere classified: Secondary | ICD-10-CM | POA: Diagnosis not present

## 2018-05-18 DIAGNOSIS — G4719 Other hypersomnia: Secondary | ICD-10-CM | POA: Diagnosis not present

## 2018-05-24 ENCOUNTER — Encounter (HOSPITAL_BASED_OUTPATIENT_CLINIC_OR_DEPARTMENT_OTHER)
Admission: RE | Admit: 2018-05-24 | Discharge: 2018-05-24 | Disposition: A | Discharge status: Home / Self Care | Payer: BLUE CROSS/BLUE SHIELD | Source: Ambulatory Visit | Attending: Orthopaedic Surgery | Admitting: Orthopaedic Surgery

## 2018-05-24 DIAGNOSIS — X58XXXA Exposure to other specified factors, initial encounter: Secondary | ICD-10-CM | POA: Diagnosis not present

## 2018-05-24 DIAGNOSIS — M7541 Impingement syndrome of right shoulder: Secondary | ICD-10-CM | POA: Diagnosis not present

## 2018-05-24 DIAGNOSIS — M19011 Primary osteoarthritis, right shoulder: Secondary | ICD-10-CM | POA: Diagnosis not present

## 2018-05-24 DIAGNOSIS — M7521 Bicipital tendinitis, right shoulder: Secondary | ICD-10-CM | POA: Diagnosis not present

## 2018-05-24 DIAGNOSIS — F419 Anxiety disorder, unspecified: Secondary | ICD-10-CM | POA: Diagnosis not present

## 2018-05-24 DIAGNOSIS — S43431A Superior glenoid labrum lesion of right shoulder, initial encounter: Secondary | ICD-10-CM | POA: Diagnosis not present

## 2018-05-24 DIAGNOSIS — I10 Essential (primary) hypertension: Secondary | ICD-10-CM | POA: Diagnosis not present

## 2018-05-24 DIAGNOSIS — F329 Major depressive disorder, single episode, unspecified: Secondary | ICD-10-CM | POA: Diagnosis not present

## 2018-05-24 DIAGNOSIS — Z79899 Other long term (current) drug therapy: Secondary | ICD-10-CM | POA: Diagnosis not present

## 2018-05-24 LAB — BASIC METABOLIC PANEL
Anion gap: 8 (ref 5–15)
BUN: 13 mg/dL (ref 6–20)
CO2: 25 mmol/L (ref 22–32)
Calcium: 9.4 mg/dL (ref 8.9–10.3)
Chloride: 104 mmol/L (ref 98–111)
Creatinine, Ser: 1.56 mg/dL — ABNORMAL HIGH (ref 0.61–1.24)
GFR calc Af Amer: 59 mL/min — ABNORMAL LOW (ref >60)
GFR calc non Af Amer: 51 mL/min — ABNORMAL LOW (ref >60)
Glucose, Bld: 92 mg/dL (ref 70–99)
Potassium: 4.1 mmol/L (ref 3.5–5.1)
Sodium: 137 mmol/L (ref 135–145)

## 2018-05-27 ENCOUNTER — Other Ambulatory Visit: Payer: Self-pay

## 2018-05-27 ENCOUNTER — Encounter (HOSPITAL_BASED_OUTPATIENT_CLINIC_OR_DEPARTMENT_OTHER)
Admission: RE | Disposition: A | Discharge status: Home / Self Care | Payer: Self-pay | Source: Home / Self Care | Attending: Orthopaedic Surgery

## 2018-05-27 ENCOUNTER — Ambulatory Visit (HOSPITAL_BASED_OUTPATIENT_CLINIC_OR_DEPARTMENT_OTHER)
Admission: RE | Admit: 2018-05-27 | Discharge: 2018-05-27 | Disposition: A | Discharge status: Home / Self Care | Payer: BLUE CROSS/BLUE SHIELD | Attending: Orthopaedic Surgery | Admitting: Orthopaedic Surgery

## 2018-05-27 ENCOUNTER — Ambulatory Visit (HOSPITAL_BASED_OUTPATIENT_CLINIC_OR_DEPARTMENT_OTHER): Payer: BLUE CROSS/BLUE SHIELD | Admitting: Certified Registered"

## 2018-05-27 ENCOUNTER — Encounter (HOSPITAL_BASED_OUTPATIENT_CLINIC_OR_DEPARTMENT_OTHER): Payer: Self-pay | Admitting: Certified Registered"

## 2018-05-27 DIAGNOSIS — F419 Anxiety disorder, unspecified: Secondary | ICD-10-CM | POA: Insufficient documentation

## 2018-05-27 DIAGNOSIS — S43431A Superior glenoid labrum lesion of right shoulder, initial encounter: Secondary | ICD-10-CM | POA: Insufficient documentation

## 2018-05-27 DIAGNOSIS — M7521 Bicipital tendinitis, right shoulder: Secondary | ICD-10-CM | POA: Insufficient documentation

## 2018-05-27 DIAGNOSIS — F329 Major depressive disorder, single episode, unspecified: Secondary | ICD-10-CM | POA: Diagnosis not present

## 2018-05-27 DIAGNOSIS — Z79899 Other long term (current) drug therapy: Secondary | ICD-10-CM | POA: Insufficient documentation

## 2018-05-27 DIAGNOSIS — I10 Essential (primary) hypertension: Secondary | ICD-10-CM | POA: Insufficient documentation

## 2018-05-27 DIAGNOSIS — M24111 Other articular cartilage disorders, right shoulder: Secondary | ICD-10-CM | POA: Diagnosis not present

## 2018-05-27 DIAGNOSIS — M7541 Impingement syndrome of right shoulder: Secondary | ICD-10-CM | POA: Insufficient documentation

## 2018-05-27 DIAGNOSIS — G8918 Other acute postprocedural pain: Secondary | ICD-10-CM | POA: Diagnosis not present

## 2018-05-27 DIAGNOSIS — X58XXXA Exposure to other specified factors, initial encounter: Secondary | ICD-10-CM | POA: Diagnosis not present

## 2018-05-27 DIAGNOSIS — M19011 Primary osteoarthritis, right shoulder: Secondary | ICD-10-CM | POA: Diagnosis not present

## 2018-05-27 HISTORY — PX: SHOULDER ACROMIOPLASTY: SHX6093

## 2018-05-27 HISTORY — PX: BICEPT TENODESIS: SHX5116

## 2018-05-27 HISTORY — DX: Anxiety disorder, unspecified: F41.9

## 2018-05-27 HISTORY — DX: Major depressive disorder, single episode, unspecified: F32.9

## 2018-05-27 HISTORY — DX: Pure hypercholesterolemia, unspecified: E78.00

## 2018-05-27 HISTORY — DX: Depression, unspecified: F32.A

## 2018-05-27 HISTORY — PX: SHOULDER ARTHROSCOPY WITH DISTAL CLAVICLE RESECTION: SHX5675

## 2018-05-27 HISTORY — DX: Essential (primary) hypertension: I10

## 2018-05-27 HISTORY — DX: Unspecified osteoarthritis, unspecified site: M19.90

## 2018-05-27 SURGERY — SHOULDER ACROMIOPLASTY
Anesthesia: General | Site: Shoulder | Laterality: Right

## 2018-05-27 MED ORDER — BUPIVACAINE HCL (PF) 0.25 % IJ SOLN
INTRAMUSCULAR | Status: AC
  Filled 2018-05-27: qty 60

## 2018-05-27 MED ORDER — PHENYLEPHRINE HCL 10 MG/ML IJ SOLN
INTRAMUSCULAR | Status: DC | PRN
  Administered 2018-05-27 (×2): 40 ug via INTRAVENOUS

## 2018-05-27 MED ORDER — FENTANYL CITRATE (PF) 100 MCG/2ML IJ SOLN
50.0000 ug | INTRAMUSCULAR | Status: DC | PRN
  Administered 2018-05-27: 50 ug via INTRAVENOUS
  Administered 2018-05-27: 100 ug via INTRAVENOUS

## 2018-05-27 MED ORDER — SCOPOLAMINE 1 MG/3DAYS TD PT72: 1.0000 | MEDICATED_PATCH | Freq: Once | TRANSDERMAL | Status: DC | PRN

## 2018-05-27 MED ORDER — EPINEPHRINE 30 MG/30ML IJ SOLN
INTRAMUSCULAR | Status: AC
  Filled 2018-05-27: qty 1

## 2018-05-27 MED ORDER — CHLORHEXIDINE GLUCONATE 4 % EX LIQD: 60.0000 mL | Freq: Once | CUTANEOUS | Status: DC

## 2018-05-27 MED ORDER — METHYLPREDNISOLONE ACETATE 80 MG/ML IJ SUSP
INTRAMUSCULAR | Status: AC
  Filled 2018-05-27: qty 1

## 2018-05-27 MED ORDER — BUPIVACAINE LIPOSOME 1.3 % IJ SUSP
INTRAMUSCULAR | Status: DC | PRN
  Administered 2018-05-27: 10 mL

## 2018-05-27 MED ORDER — LACTATED RINGERS IV SOLN
INTRAVENOUS | Status: DC
  Administered 2018-05-27 (×2): via INTRAVENOUS

## 2018-05-27 MED ORDER — MIDAZOLAM HCL 2 MG/2ML IJ SOLN
1.0000 mg | INTRAMUSCULAR | Status: DC | PRN
  Administered 2018-05-27: 2 mg via INTRAVENOUS

## 2018-05-27 MED ORDER — METHYLPREDNISOLONE ACETATE 40 MG/ML IJ SUSP
INTRAMUSCULAR | Status: AC
  Filled 2018-05-27: qty 1

## 2018-05-27 MED ORDER — EPHEDRINE SULFATE 50 MG/ML IJ SOLN
INTRAMUSCULAR | Status: DC | PRN
  Administered 2018-05-27 (×3): 10 mg via INTRAVENOUS

## 2018-05-27 MED ORDER — SODIUM CHLORIDE 0.9 % IR SOLN
Status: DC | PRN
  Administered 2018-05-27: 12000 mL

## 2018-05-27 MED ORDER — PROPOFOL 10 MG/ML IV BOLUS
INTRAVENOUS | Status: DC | PRN
  Administered 2018-05-27: 200 mg via INTRAVENOUS

## 2018-05-27 MED ORDER — DEXAMETHASONE SODIUM PHOSPHATE 4 MG/ML IJ SOLN
INTRAMUSCULAR | Status: DC | PRN
  Administered 2018-05-27: 4 mg via INTRAVENOUS

## 2018-05-27 MED ORDER — CEFAZOLIN SODIUM-DEXTROSE 2-4 GM/100ML-% IV SOLN
INTRAVENOUS | Status: AC
  Filled 2018-05-27: qty 100

## 2018-05-27 MED ORDER — SUGAMMADEX SODIUM 500 MG/5ML IV SOLN
INTRAVENOUS | Status: DC | PRN
  Administered 2018-05-27: 200 mg via INTRAVENOUS

## 2018-05-27 MED ORDER — ONDANSETRON HCL 4 MG/2ML IJ SOLN
INTRAMUSCULAR | Status: DC | PRN
  Administered 2018-05-27: 4 mg via INTRAVENOUS

## 2018-05-27 MED ORDER — MIDAZOLAM HCL 2 MG/2ML IJ SOLN
INTRAMUSCULAR | Status: AC
  Filled 2018-05-27: qty 2

## 2018-05-27 MED ORDER — CEFAZOLIN SODIUM-DEXTROSE 2-4 GM/100ML-% IV SOLN
2.0000 g | INTRAVENOUS | Status: AC
  Administered 2018-05-27: 2 g via INTRAVENOUS

## 2018-05-27 MED ORDER — SODIUM CHLORIDE 0.9 % IR SOLN
Status: DC | PRN
  Administered 2018-05-27: 4 mL

## 2018-05-27 MED ORDER — ROCURONIUM BROMIDE 100 MG/10ML IV SOLN
INTRAVENOUS | Status: DC | PRN
  Administered 2018-05-27: 50 mg via INTRAVENOUS

## 2018-05-27 MED ORDER — FENTANYL CITRATE (PF) 100 MCG/2ML IJ SOLN
INTRAMUSCULAR | Status: AC
  Filled 2018-05-27: qty 2

## 2018-05-27 MED ORDER — BUPIVACAINE HCL (PF) 0.5 % IJ SOLN
INTRAMUSCULAR | Status: DC | PRN
  Administered 2018-05-27: 15 mL via PERINEURAL

## 2018-05-27 SURGICAL SUPPLY — 72 items
ANCHOR SUT FBRTK 2 WIRE (Anchor) ×9 IMPLANT
BLADE EXCALIBUR 4.0X13 (MISCELLANEOUS) ×3 IMPLANT
BLADE SHAVER BONE 5.0X13 (MISCELLANEOUS) ×3 IMPLANT
BNDG COHESIVE 4X5 TAN STRL (GAUZE/BANDAGES/DRESSINGS) IMPLANT
BURR OVAL 8 FLU 4.0X13 (MISCELLANEOUS) ×3 IMPLANT
CANNULA 5.75X71 LONG (CANNULA) IMPLANT
CANNULA PASSPORT 5 (CANNULA) ×3 IMPLANT
CANNULA PASSPORT BUTTON 10-40 (CANNULA) IMPLANT
CANNULA TWIST IN 8.25X7CM (CANNULA) IMPLANT
CHLORAPREP W/TINT 26 (MISCELLANEOUS) ×3 IMPLANT
COVER WAND RF STERILE (DRAPES) IMPLANT
DECANTER SPIKE VIAL GLASS SM (MISCELLANEOUS) IMPLANT
DISSECTOR 3.5MM X 13CM CVD (MISCELLANEOUS) IMPLANT
DISSECTOR 4.0MMX13CM CVD (MISCELLANEOUS) IMPLANT
DRAPE IMP U-DRAPE 54X76 (DRAPES) ×3 IMPLANT
DRAPE INCISE IOBAN 66X45 STRL (DRAPES) ×3 IMPLANT
DRAPE SHOULDER BEACH CHAIR (DRAPES) ×3 IMPLANT
DRSG PAD ABDOMINAL 8X10 ST (GAUZE/BANDAGES/DRESSINGS) ×3 IMPLANT
DW OUTFLOW CASSETTE/TUBE SET (MISCELLANEOUS) ×3 IMPLANT
ELECT NEEDLE TIP 2.8 STRL (NEEDLE) IMPLANT
ELECT REM PT RETURN 9FT ADLT (ELECTROSURGICAL)
ELECTRODE REM PT RTRN 9FT ADLT (ELECTROSURGICAL) IMPLANT
GAUZE SPONGE 4X4 12PLY STRL (GAUZE/BANDAGES/DRESSINGS) ×6 IMPLANT
GAUZE XEROFORM 1X8 LF (GAUZE/BANDAGES/DRESSINGS) ×3 IMPLANT
GLOVE BIOGEL PI IND STRL 7.0 (GLOVE) ×2 IMPLANT
GLOVE BIOGEL PI IND STRL 8 (GLOVE) ×4 IMPLANT
GLOVE BIOGEL PI INDICATOR 7.0 (GLOVE) ×1
GLOVE BIOGEL PI INDICATOR 8 (GLOVE) ×2
GLOVE ECLIPSE 6.5 STRL STRAW (GLOVE) ×3 IMPLANT
GLOVE ECLIPSE 8.0 STRL XLNG CF (GLOVE) ×3 IMPLANT
GOWN STRL REUS W/ TWL LRG LVL3 (GOWN DISPOSABLE) ×2 IMPLANT
GOWN STRL REUS W/TWL LRG LVL3 (GOWN DISPOSABLE) ×1
GOWN STRL REUS W/TWL XL LVL3 (GOWN DISPOSABLE) ×3 IMPLANT
KIT SPEAR STR 1.6MM DRILL (MISCELLANEOUS) ×3 IMPLANT
KIT STABILIZATION SHOULDER (MISCELLANEOUS) ×3 IMPLANT
KIT STR SPEAR 1.8 FBRTK DISP (KITS) IMPLANT
LASSO 90 CVE QUICKPAS (DISPOSABLE) IMPLANT
MANIFOLD NEPTUNE II (INSTRUMENTS) ×3 IMPLANT
NDL SAFETY ECLIPSE 18X1.5 (NEEDLE) ×2 IMPLANT
NDL SUT 6 .5 CRC .975X.05 MAYO (NEEDLE) IMPLANT
NEEDLE HYPO 18GX1.5 SHARP (NEEDLE) ×1
NEEDLE MAYO TAPER (NEEDLE)
NEEDLE SCORPION MULTI FIRE (NEEDLE) IMPLANT
PACK ARTHROSCOPY DSU (CUSTOM PROCEDURE TRAY) ×3 IMPLANT
PACK BASIN DAY SURGERY FS (CUSTOM PROCEDURE TRAY) ×3 IMPLANT
PENCIL BUTTON HOLSTER BLD 10FT (ELECTRODE) IMPLANT
PORT APPOLLO RF 90DEGREE MULTI (SURGICAL WAND) ×3 IMPLANT
RESTRAINT HEAD UNIVERSAL NS (MISCELLANEOUS) ×3 IMPLANT
SHEET MEDIUM DRAPE 40X70 STRL (DRAPES) IMPLANT
SLEEVE SCD COMPRESS KNEE MED (MISCELLANEOUS) ×3 IMPLANT
SLING ARM FOAM STRAP LRG (SOFTGOODS) IMPLANT
SLING ARM IMMOBILIZER LRG (SOFTGOODS) IMPLANT
SLING ARM IMMOBILIZER MED (SOFTGOODS) IMPLANT
SLING ARM MED ADULT FOAM STRAP (SOFTGOODS) IMPLANT
SLING ARM XL FOAM STRAP (SOFTGOODS) IMPLANT
SPONGE LAP 4X18 RFD (DISPOSABLE) IMPLANT
STRIP CLOSURE SKIN 1/2X4 (GAUZE/BANDAGES/DRESSINGS) IMPLANT
SUCTION FRAZIER HANDLE 10FR (MISCELLANEOUS)
SUCTION TUBE FRAZIER 10FR DISP (MISCELLANEOUS) IMPLANT
SUT FIBERWIRE #2 38 T-5 BLUE (SUTURE)
SUT MNCRL AB 4-0 PS2 18 (SUTURE) ×3 IMPLANT
SUT PDS AB 1 CT  36 (SUTURE) ×1
SUT PDS AB 1 CT 36 (SUTURE) ×2 IMPLANT
SUT TIGER TAPE 7 IN WHITE (SUTURE) IMPLANT
SUTURE FIBERWR #2 38 T-5 BLUE (SUTURE) IMPLANT
SUTURE TAPE TIGERLINK 1.3MM BL (SUTURE) IMPLANT
SUTURETAPE TIGERLINK 1.3MM BL (SUTURE)
SYR 5ML LUER SLIP (SYRINGE) ×3 IMPLANT
TAPE FIBER 2MM 7IN #2 BLUE (SUTURE) IMPLANT
TOWEL GREEN STERILE FF (TOWEL DISPOSABLE) ×3 IMPLANT
TUBE SUCTION HIGH CAP CLEAR NV (SUCTIONS) IMPLANT
TUBING ARTHROSCOPY IRRIG 16FT (MISCELLANEOUS) ×3 IMPLANT

## 2018-05-27 NOTE — Addendum Note (Signed)
Addendum  created 05/27/18 1429 by Yechiel Erny, Jewel Baize, CRNA   Intraprocedure Flowsheets edited

## 2018-05-27 NOTE — Transfer of Care (Signed)
Immediate Anesthesia Transfer of Care Note  Patient: Darryl Chaney  Procedure(s) Performed: IRRIGATION AND DEBRIDEMENT SHOULDER (Right ) ARTHROSCOPIC ROTATOR CUFF REPAIR (Right ) SHOULDER ACROMIOPLASTY (Right ) SHOULDER ARTHROSCOPY WITH DISTAL CLAVICLE RESECTION (Right )  Patient Location: PACU  Anesthesia Type:GA combined with regional for post-op pain  Level of Consciousness: awake, alert , oriented and patient cooperative  Airway & Oxygen Therapy: Patient Spontanous Breathing and Patient connected to face mask oxygen  Post-op Assessment: Report given to RN and Post -op Vital signs reviewed and stable  Post vital signs: Reviewed and stable  Last Vitals:  Vitals Value Taken Time  BP    Temp    Pulse    Resp    SpO2      Last Pain:  Vitals:   05/27/18 0634  TempSrc: Oral         Complications: No apparent anesthesia complications

## 2018-05-27 NOTE — Op Note (Signed)
Orthopaedic Surgery Operative Note (CSN: [id number])  Darryl Chaney  1971/02/20 Date of Surgery: 05/27/2018   Diagnoses:  AC arthrosis R shoulder, biceps tendinitis and slap tear  Procedure: Scope extensive debridement Arthroscopic biceps tenodesis Arthroscopic distal clavicle excision Arthroscopic subacromial decompression   Operative Finding Successful completion of planned procedure.    Exam under anesthesia: Full motion, no abnormalities Articular space: No loose bodies, capsule intact, SLAP and anterior labral fraying noted. Chondral surfaces:Intact, mild humeral head softening. Biceps: Type 2 slap Subscapularis: partial upper border tearing, debrided, careful exam performed but felt that repair would over tighten shoulder and >70% thickness preserved. Superior Cuff:intact Bursal side: intact cuff  Post-operative plan: The patient will be NWB in sling x4 weeks.  The patient will be dc home.  DVT prophylaxis not indicated in isolated upper extremity surgery patient with no specific risks factors .  Pain control with PRN pain medication preferring oral medicines.  Follow up plan will be scheduled in approximately 7 days for incision check and XR.  Post-Op Diagnosis: Same Surgeons:Primary: Bjorn Pippin, MD Assistants: Location: [facility] OR ROOM 6 Anesthesia: Choice Antibiotics: Ancef 2g preop Tourniquet time: * No tourniquets in log * Estimated Blood Loss: minimal Complications: None Specimens: None Implants: * No implants in log *  Indications for Surgery:   Darryl Chaney is a 48 y.o. male with continued anterior shoulder pain for extended period of time.  He had good results temporarily with injection of biceps under ultrasound but this was transient.  Benefits and risks of operative and nonoperative management were discussed prior to surgery with patient/guardian(s) and informed consent form was completed.  Specific risks including infection, need for additional  surgery, continued pain, cuff failure.   Procedure:   Patient was correctly identified in the preoperative holding area and operative site marked.  Patient brought to OR and positioned beachchair on an Keyser table ensuring that all bony prominences were padded and the head was in an appropriate location.  Anesthesia was induced and the operative shoulder was prepped and draped in the usual sterile fashion.  Timeout was called preincision.  A standard posterior viewing portal was made after localizing the portal with a spinal needle.  An anterior accessory portal was also made.  After clearing the articular space the camera was positioned in the subacromial space.  Findings above.  Extensive debridement of labrum, bursa and anterior interval performed with shaver.  Subacromial decompression: We made a lateral portal with spinal needle guidance. We then proceeded to debride bursal tissue extensively with a shaver and arthrocare device. At that point we continued to identify the borders of the acromion and identify the spur. We then carefully preserved the deltoid fascia and used a burr to convert the type 2 acromion to a Type 1 flat acromion without issue.  Distal Clavicle resection:  The scope was placed in the subacromial space from the posterior portal.  A hemostat was placed through the anterior portal and we spread at the [facility] joint.  A burr was then inserted and 10 mm of distal clavicle was resected taking care to avoid damage to the capsule around the joint and avoiding overhanging bone posteriorly.    Biceps tenodesis: We marked the tendon and then performed a tenotomy and debridement of the stump in the articular space. We then identified the biceps tendon in its groove suprapec with the arthroscope in the lateral portal taking care to move from lateral to medial to avoid injury to the subscapularis. At  that point we unroofed the tendon itself and mobilized it. An accessory anterior portal was made  in line with the tendon and we grasped it from the anterior superior portal and worked from the accessory anterior portal. 3 Fibertak 1.32mm anchors were placed in the groove and the tendon was secured in a luggage loop style fashion with good tension on the tendon.  The incisions were closed with absorbable monocryl, benzoin and steri strips.  A sterile dressing was placed along with a sling. The patient was awoken from general anesthesia and taken to the PACU in stable condition without complication.

## 2018-05-27 NOTE — Progress Notes (Signed)
Assisted Dr. Witman with right, ultrasound guided, interscalene  block. Side rails up, monitors on throughout procedure. See vital signs in flow sheet. Tolerated Procedure well. 

## 2018-05-27 NOTE — Anesthesia Preprocedure Evaluation (Addendum)
Anesthesia Evaluation  Patient identified by MRN, date of birth, ID band Patient awake    Reviewed: Allergy & Precautions, NPO status , Patient's Chart, lab work & pertinent test results  History of Anesthesia Complications Negative for: history of anesthetic complications  Airway Mallampati: II  TM Distance: >3 FB Neck ROM: Full    Dental no notable dental hx. (+) Teeth Intact   Pulmonary neg pulmonary ROS,    Pulmonary exam normal        Cardiovascular hypertension, Normal cardiovascular exam     Neuro/Psych PSYCHIATRIC DISORDERS Anxiety Depression negative neurological ROS     GI/Hepatic negative GI ROS, Neg liver ROS,   Endo/Other  negative endocrine ROS  Renal/GU negative Renal ROS  negative genitourinary   Musculoskeletal negative musculoskeletal ROS (+)   Abdominal   Peds  Hematology negative hematology ROS (+)   Anesthesia Other Findings   Reproductive/Obstetrics                            Anesthesia Physical Anesthesia Plan  ASA: II  Anesthesia Plan: General   Post-op Pain Management: GA combined w/ Regional for post-op pain   Induction: Intravenous  PONV Risk Score and Plan: 2 and Ondansetron, Dexamethasone, Midazolam and Treatment may vary due to age or medical condition  Airway Management Planned: Oral ETT  Additional Equipment: None  Intra-op Plan:   Post-operative Plan: Extubation in OR  Informed Consent: I have reviewed the patients History and Physical, chart, labs and discussed the procedure including the risks, benefits and alternatives for the proposed anesthesia with the patient or authorized representative who has indicated his/her understanding and acceptance.       Plan Discussed with:   Anesthesia Plan Comments:        Anesthesia Quick Evaluation

## 2018-05-27 NOTE — Anesthesia Procedure Notes (Signed)
Procedure Name: Intubation Date/Time: 05/27/2018 7:39 AM Performed by: Signe Colt, CRNA Pre-anesthesia Checklist: Patient identified, Emergency Drugs available, Suction available and Patient being monitored Patient Re-evaluated:Patient Re-evaluated prior to induction Oxygen Delivery Method: Circle system utilized Preoxygenation: Pre-oxygenation with 100% oxygen Induction Type: IV induction Ventilation: Mask ventilation without difficulty Laryngoscope Size: Mac and 3 Grade View: Grade I Tube type: Oral Tube size: 7.0 mm Number of attempts: 1 Airway Equipment and Method: Stylet and Oral airway Placement Confirmation: ETT inserted through vocal cords under direct vision,  positive ETCO2 and breath sounds checked- equal and bilateral Secured at: 22 cm Tube secured with: Tape Dental Injury: Teeth and Oropharynx as per pre-operative assessment

## 2018-05-27 NOTE — H&P (Signed)
PREOPERATIVE H&P  Chief Complaint: OSTEOARTHRITIS RIGHT SHOULDER,IMPINGEMENT SYNDROME,STRAIN OF MUSCLES AND TENDONS OF THE ROTATOR CUFF  HPI: Darryl Chaney is a 48 y.o. male who presents for preoperative history and physical with a diagnosis of OSTEOARTHRITIS RIGHT SHOULDER,IMPINGEMENT SYNDROME,STRAIN OF MUSCLES AND TENDONS OF THE ROTATOR CUFF. Symptoms are rated as moderate to severe, and have been worsening.  This is significantly impairing activities of daily living.  Please see my clinic note for full details on this patient's care.  He has elected for surgical management.   Past Medical History:  Diagnosis Date  . Anxiety   . Depression   . High cholesterol   . Hypertension   . Osteoarthritis    right shoulder   Past Surgical History:  Procedure Laterality Date  . NO PAST SURGERIES     Social History   Socioeconomic History  . Marital status: Married    Spouse name: Not on file  . Number of children: Not on file  . Years of education: Not on file  . Highest education level: Not on file  Occupational History  . Not on file  Social Needs  . Financial resource strain: Not on file  . Food insecurity:    Worry: Not on file    Inability: Not on file  . Transportation needs:    Medical: Not on file    Non-medical: Not on file  Tobacco Use  . Smoking status: Never Smoker  . Smokeless tobacco: Never Used  Substance and Sexual Activity  . Alcohol use: Yes    Comment: social  . Drug use: Never  . Sexual activity: Not on file  Lifestyle  . Physical activity:    Days per week: Not on file    Minutes per session: Not on file  . Stress: Not on file  Relationships  . Social connections:    Talks on phone: Not on file    Gets together: Not on file    Attends religious service: Not on file    Active member of club or organization: Not on file    Attends meetings of clubs or organizations: Not on file    Relationship status: Not on file  Other Topics Concern  . Not on  file  Social History Narrative  . Not on file   History reviewed. No pertinent family history. Allergies  Allergen Reactions  . Neosporin [Neomycin-Bacitracin Zn-Polymyx]     Itching..   Prior to Admission medications   Medication Sig Start Date End Date Taking? Authorizing Provider  clonazePAM (KLONOPIN) 0.5 MG tablet Take 0.5 mg by mouth at bedtime.   Yes [provider]  PARoxetine (PAXIL) 40 MG tablet Take 40 mg by mouth every morning.   Yes [provider]  rosuvastatin (CRESTOR) 10 MG tablet Take 10 mg by mouth daily.   Yes [provider]  testosterone cypionate (DEPOTESTOTERONE CYPIONATE) 100 MG/ML injection Inject into the muscle once a week. For IM use only   Yes [provider]  valsartan-hydrochlorothiazide (DIOVAN-HCT) 160-12.5 MG per tablet Take 1 tablet by mouth daily.   Yes [provider]     Positive ROS: All other systems have been reviewed and were otherwise negative with the exception of those mentioned in the HPI and as above.  Physical Exam: General: Alert, no acute distress Cardiovascular: No pedal edema Respiratory: No cyanosis, no use of accessory musculature GI: No organomegaly, abdomen is soft and non-tender Skin: No lesions in the area of chief complaint Neurologic: Sensation  intact distally Psychiatric: Patient is competent for consent with normal mood and affect Lymphatic: No axillary or cervical lymphadenopathy  MUSCULOSKELETAL:R shoulder, AC arthrosis, biceps groove related pain.    Assessment: OSTEOARTHRITIS RIGHT SHOULDER,IMPINGEMENT SYNDROME,STRAIN OF MUSCLES AND TENDONS OF THE ROTATOR CUFF  Plan: Plan for Procedure(s): IRRIGATION AND DEBRIDEMENT SHOULDER ARTHROSCOPIC ROTATOR CUFF REPAIR SHOULDER ACROMIOPLASTY SHOULDER ARTHROSCOPY WITH DISTAL CLAVICLE RESECTION  The risks benefits and alternatives were discussed with the patient including but not limited to the risks of nonoperative treatment,  versus surgical intervention including infection, bleeding, nerve injury,  blood clots, cardiopulmonary complications, morbidity, mortality, among others, and they were willing to proceed.   Discussed at length that patient would potentially have a chance for biceps tenodesis failure and possibility of asymmetry.  We had discussed this in the past and he elected to proceed again after this.   Bjorn Pippin, MD  05/27/2018 7:31 AM

## 2018-05-27 NOTE — Discharge Instructions (Signed)

## 2018-05-27 NOTE — Anesthesia Procedure Notes (Signed)
Anesthesia Regional Block: Interscalene brachial plexus block   Pre-Anesthetic Checklist: ,, timeout performed, Correct Patient, Correct Site, Correct Laterality, Correct Procedure, Correct Position, site marked, Risks and benefits discussed,  Surgical consent,  Pre-op evaluation,  At surgeon's request and post-op pain management  Laterality: Right  Prep: chloraprep       Needles:  Injection technique: Single-shot  Needle Type: Echogenic Stimulator Needle     Needle Length: 9cm  Needle Gauge: 21     Additional Needles:   Procedures:,,,, ultrasound used (permanent image in chart),,,,  Narrative:  Start time: 05/27/2018 7:20 AM End time: 05/27/2018 7:27 AM Injection made incrementally with aspirations every 5 mL.  Performed by: Personally  Anesthesiologist: Lucretia Kern, MD  Additional Notes: Monitors applied. Injection made in 5cc increments. No resistance to injection. Good needle visualization. Patient tolerated procedure well.

## 2018-05-27 NOTE — Anesthesia Postprocedure Evaluation (Signed)
Anesthesia Post Note  Patient: Darryl Chaney  Procedure(s) Performed: IRRIGATION AND DEBRIDEMENT SHOULDER (Right ) ARTHROSCOPIC ROTATOR CUFF REPAIR (Right ) SHOULDER ACROMIOPLASTY (Right ) SHOULDER ARTHROSCOPY WITH DISTAL CLAVICLE RESECTION (Right )     Patient location during evaluation: PACU Anesthesia Type: General Level of consciousness: awake and alert Pain management: pain level controlled Vital Signs Assessment: post-procedure vital signs reviewed and stable Respiratory status: spontaneous breathing, nonlabored ventilation and respiratory function stable Cardiovascular status: blood pressure returned to baseline and stable Postop Assessment: no apparent nausea or vomiting Anesthetic complications: no    Last Vitals:  Vitals:   05/27/18 1015 05/27/18 1030  BP: 140/78 (!) 146/90  Pulse: 93 96  Resp: 19 15  Temp:    SpO2: 94% 94%    Last Pain:  Vitals:   05/27/18 1015  TempSrc:   PainSc: 0-No pain                 Lucretia Kern

## 2018-05-28 ENCOUNTER — Encounter (HOSPITAL_BASED_OUTPATIENT_CLINIC_OR_DEPARTMENT_OTHER): Payer: Self-pay | Admitting: Orthopaedic Surgery

## 2018-06-04 DIAGNOSIS — M19011 Primary osteoarthritis, right shoulder: Secondary | ICD-10-CM | POA: Diagnosis not present

## 2018-06-07 DIAGNOSIS — G4733 Obstructive sleep apnea (adult) (pediatric): Secondary | ICD-10-CM | POA: Diagnosis not present

## 2018-06-08 DIAGNOSIS — M25511 Pain in right shoulder: Secondary | ICD-10-CM | POA: Diagnosis not present

## 2018-06-08 DIAGNOSIS — M6281 Muscle weakness (generalized): Secondary | ICD-10-CM | POA: Diagnosis not present

## 2018-06-08 DIAGNOSIS — M25611 Stiffness of right shoulder, not elsewhere classified: Secondary | ICD-10-CM | POA: Diagnosis not present

## 2018-06-09 DIAGNOSIS — G4733 Obstructive sleep apnea (adult) (pediatric): Secondary | ICD-10-CM | POA: Diagnosis not present

## 2018-06-11 DIAGNOSIS — G4733 Obstructive sleep apnea (adult) (pediatric): Secondary | ICD-10-CM | POA: Diagnosis not present

## 2018-06-14 DIAGNOSIS — M25511 Pain in right shoulder: Secondary | ICD-10-CM | POA: Diagnosis not present

## 2018-06-14 DIAGNOSIS — M6281 Muscle weakness (generalized): Secondary | ICD-10-CM | POA: Diagnosis not present

## 2018-06-14 DIAGNOSIS — M25611 Stiffness of right shoulder, not elsewhere classified: Secondary | ICD-10-CM | POA: Diagnosis not present

## 2018-06-24 DIAGNOSIS — M25511 Pain in right shoulder: Secondary | ICD-10-CM | POA: Diagnosis not present

## 2018-06-24 DIAGNOSIS — M6281 Muscle weakness (generalized): Secondary | ICD-10-CM | POA: Diagnosis not present

## 2018-06-24 DIAGNOSIS — M25611 Stiffness of right shoulder, not elsewhere classified: Secondary | ICD-10-CM | POA: Diagnosis not present

## 2018-07-01 DIAGNOSIS — M6281 Muscle weakness (generalized): Secondary | ICD-10-CM | POA: Diagnosis not present

## 2018-07-01 DIAGNOSIS — M25511 Pain in right shoulder: Secondary | ICD-10-CM | POA: Diagnosis not present

## 2018-07-01 DIAGNOSIS — M25611 Stiffness of right shoulder, not elsewhere classified: Secondary | ICD-10-CM | POA: Diagnosis not present

## 2018-07-12 DIAGNOSIS — G4733 Obstructive sleep apnea (adult) (pediatric): Secondary | ICD-10-CM | POA: Diagnosis not present

## 2018-08-10 ENCOUNTER — Encounter: Admission: RE | Payer: Self-pay | Source: Home / Self Care

## 2018-08-10 ENCOUNTER — Ambulatory Visit: Admission: RE | Admit: 2018-08-10 | Payer: BLUE CROSS/BLUE SHIELD | Source: Home / Self Care | Admitting: Ophthalmology

## 2018-08-10 SURGERY — PHACOEMULSIFICATION, CATARACT, WITH IOL INSERTION
Anesthesia: Choice | Laterality: Left

## 2018-08-11 DIAGNOSIS — G4733 Obstructive sleep apnea (adult) (pediatric): Secondary | ICD-10-CM | POA: Diagnosis not present

## 2018-08-24 DIAGNOSIS — G4733 Obstructive sleep apnea (adult) (pediatric): Secondary | ICD-10-CM | POA: Diagnosis not present

## 2018-09-11 DIAGNOSIS — G4733 Obstructive sleep apnea (adult) (pediatric): Secondary | ICD-10-CM | POA: Diagnosis not present

## 2018-11-02 DIAGNOSIS — H02409 Unspecified ptosis of unspecified eyelid: Secondary | ICD-10-CM | POA: Diagnosis not present

## 2018-12-21 DIAGNOSIS — E559 Vitamin D deficiency, unspecified: Secondary | ICD-10-CM | POA: Diagnosis not present

## 2018-12-21 DIAGNOSIS — E785 Hyperlipidemia, unspecified: Secondary | ICD-10-CM | POA: Diagnosis not present

## 2018-12-21 DIAGNOSIS — Z23 Encounter for immunization: Secondary | ICD-10-CM | POA: Diagnosis not present

## 2018-12-21 DIAGNOSIS — E230 Hypopituitarism: Secondary | ICD-10-CM | POA: Diagnosis not present

## 2018-12-21 DIAGNOSIS — H02839 Dermatochalasis of unspecified eye, unspecified eyelid: Secondary | ICD-10-CM | POA: Diagnosis not present

## 2018-12-21 DIAGNOSIS — H5340 Unspecified visual field defects: Secondary | ICD-10-CM | POA: Diagnosis not present

## 2018-12-21 DIAGNOSIS — Z125 Encounter for screening for malignant neoplasm of prostate: Secondary | ICD-10-CM | POA: Diagnosis not present

## 2018-12-21 DIAGNOSIS — G4733 Obstructive sleep apnea (adult) (pediatric): Secondary | ICD-10-CM | POA: Diagnosis not present

## 2018-12-21 DIAGNOSIS — Z0000 Encounter for general adult medical examination without abnormal findings: Secondary | ICD-10-CM | POA: Diagnosis not present

## 2018-12-21 DIAGNOSIS — I10 Essential (primary) hypertension: Secondary | ICD-10-CM | POA: Diagnosis not present

## 2018-12-21 DIAGNOSIS — H02403 Unspecified ptosis of bilateral eyelids: Secondary | ICD-10-CM | POA: Diagnosis not present

## 2018-12-22 ENCOUNTER — Other Ambulatory Visit: Payer: Self-pay | Admitting: Internal Medicine

## 2018-12-22 DIAGNOSIS — Z8349 Family history of other endocrine, nutritional and metabolic diseases: Secondary | ICD-10-CM

## 2018-12-30 ENCOUNTER — Other Ambulatory Visit: Payer: Self-pay | Admitting: Internal Medicine

## 2018-12-30 DIAGNOSIS — Z8249 Family history of ischemic heart disease and other diseases of the circulatory system: Secondary | ICD-10-CM

## 2018-12-30 DIAGNOSIS — R9431 Abnormal electrocardiogram [ECG] [EKG]: Secondary | ICD-10-CM

## 2018-12-31 ENCOUNTER — Other Ambulatory Visit: Payer: Self-pay

## 2018-12-31 DIAGNOSIS — Z20828 Contact with and (suspected) exposure to other viral communicable diseases: Secondary | ICD-10-CM | POA: Diagnosis not present

## 2018-12-31 DIAGNOSIS — Z20822 Contact with and (suspected) exposure to covid-19: Secondary | ICD-10-CM

## 2019-01-02 LAB — NOVEL CORONAVIRUS, NAA: SARS-CoV-2, NAA: NOT DETECTED

## 2019-01-15 ENCOUNTER — Ambulatory Visit: Admit: 2019-01-15 | Payer: BLUE CROSS/BLUE SHIELD | Admitting: Ophthalmology

## 2019-01-15 SURGERY — PHACOEMULSIFICATION, CATARACT, WITH IOL INSERTION
Anesthesia: Topical | Laterality: Left

## 2019-02-02 DIAGNOSIS — R195 Other fecal abnormalities: Secondary | ICD-10-CM | POA: Diagnosis not present

## 2019-02-02 DIAGNOSIS — Z20828 Contact with and (suspected) exposure to other viral communicable diseases: Secondary | ICD-10-CM | POA: Diagnosis not present

## 2019-02-02 DIAGNOSIS — Z1211 Encounter for screening for malignant neoplasm of colon: Secondary | ICD-10-CM | POA: Diagnosis not present

## 2019-02-02 DIAGNOSIS — Z01818 Encounter for other preprocedural examination: Secondary | ICD-10-CM | POA: Diagnosis not present

## 2019-02-02 DIAGNOSIS — Z01812 Encounter for preprocedural laboratory examination: Secondary | ICD-10-CM | POA: Diagnosis not present

## 2019-02-04 DIAGNOSIS — H02839 Dermatochalasis of unspecified eye, unspecified eyelid: Secondary | ICD-10-CM | POA: Diagnosis not present

## 2019-02-04 DIAGNOSIS — I10 Essential (primary) hypertension: Secondary | ICD-10-CM | POA: Diagnosis not present

## 2019-02-04 DIAGNOSIS — Z79899 Other long term (current) drug therapy: Secondary | ICD-10-CM | POA: Diagnosis not present

## 2019-02-04 DIAGNOSIS — E7800 Pure hypercholesterolemia, unspecified: Secondary | ICD-10-CM | POA: Diagnosis not present

## 2019-02-04 DIAGNOSIS — H53453 Other localized visual field defect, bilateral: Secondary | ICD-10-CM | POA: Diagnosis not present

## 2019-02-04 DIAGNOSIS — H02831 Dermatochalasis of right upper eyelid: Secondary | ICD-10-CM | POA: Diagnosis not present

## 2019-02-04 DIAGNOSIS — E785 Hyperlipidemia, unspecified: Secondary | ICD-10-CM | POA: Diagnosis not present

## 2019-02-04 DIAGNOSIS — H02834 Dermatochalasis of left upper eyelid: Secondary | ICD-10-CM | POA: Diagnosis not present

## 2019-02-04 DIAGNOSIS — H5340 Unspecified visual field defects: Secondary | ICD-10-CM | POA: Diagnosis not present

## 2019-02-04 DIAGNOSIS — Z888 Allergy status to other drugs, medicaments and biological substances status: Secondary | ICD-10-CM | POA: Diagnosis not present

## 2019-02-04 DIAGNOSIS — H02403 Unspecified ptosis of bilateral eyelids: Secondary | ICD-10-CM | POA: Diagnosis not present

## 2019-02-04 HISTORY — PX: PTOSIS REPAIR: SHX6568

## 2019-02-08 ENCOUNTER — Encounter: Admission: RE | Payer: Self-pay | Source: Home / Self Care

## 2019-02-08 ENCOUNTER — Ambulatory Visit: Admission: RE | Admit: 2019-02-08 | Payer: BC Managed Care – PPO | Source: Home / Self Care | Admitting: Ophthalmology

## 2019-02-08 SURGERY — PHACOEMULSIFICATION, CATARACT, WITH IOL INSERTION
Anesthesia: Topical | Laterality: Left

## 2019-02-14 DIAGNOSIS — I959 Hypotension, unspecified: Secondary | ICD-10-CM | POA: Diagnosis not present

## 2019-02-14 DIAGNOSIS — R58 Hemorrhage, not elsewhere classified: Secondary | ICD-10-CM | POA: Diagnosis not present

## 2019-02-14 DIAGNOSIS — E2740 Unspecified adrenocortical insufficiency: Secondary | ICD-10-CM | POA: Diagnosis not present

## 2019-02-14 DIAGNOSIS — Z1211 Encounter for screening for malignant neoplasm of colon: Secondary | ICD-10-CM | POA: Diagnosis not present

## 2019-02-14 DIAGNOSIS — I10 Essential (primary) hypertension: Secondary | ICD-10-CM | POA: Diagnosis not present

## 2019-02-14 DIAGNOSIS — T4145XA Adverse effect of unspecified anesthetic, initial encounter: Secondary | ICD-10-CM | POA: Diagnosis not present

## 2019-02-16 ENCOUNTER — Other Ambulatory Visit: Payer: Self-pay

## 2019-02-16 DIAGNOSIS — H2512 Age-related nuclear cataract, left eye: Secondary | ICD-10-CM | POA: Diagnosis not present

## 2019-02-16 DIAGNOSIS — I10 Essential (primary) hypertension: Secondary | ICD-10-CM | POA: Diagnosis not present

## 2019-02-17 ENCOUNTER — Telehealth: Payer: Self-pay | Admitting: *Deleted

## 2019-02-17 NOTE — Telephone Encounter (Signed)
Per Darrin Nipper----- Cancel Rsn: Unhappy/Changed Provider (got a Cardiologist at another practice)

## 2019-02-18 ENCOUNTER — Other Ambulatory Visit
Admission: RE | Admit: 2019-02-18 | Discharge: 2019-02-18 | Disposition: A | Discharge status: Home / Self Care | Payer: BC Managed Care – PPO | Source: Ambulatory Visit | Attending: Ophthalmology | Admitting: Ophthalmology

## 2019-02-18 ENCOUNTER — Other Ambulatory Visit: Payer: Self-pay

## 2019-02-18 DIAGNOSIS — Z20828 Contact with and (suspected) exposure to other viral communicable diseases: Secondary | ICD-10-CM | POA: Insufficient documentation

## 2019-02-18 DIAGNOSIS — Z01812 Encounter for preprocedural laboratory examination: Secondary | ICD-10-CM | POA: Insufficient documentation

## 2019-02-18 LAB — SARS CORONAVIRUS 2 (TAT 6-24 HRS): SARS Coronavirus 2: NEGATIVE

## 2019-02-21 ENCOUNTER — Ambulatory Visit: Payer: BC Managed Care – PPO | Admitting: Cardiology

## 2019-02-21 DIAGNOSIS — E230 Hypopituitarism: Secondary | ICD-10-CM | POA: Diagnosis not present

## 2019-02-21 DIAGNOSIS — T4145XD Adverse effect of unspecified anesthetic, subsequent encounter: Secondary | ICD-10-CM | POA: Diagnosis not present

## 2019-02-21 DIAGNOSIS — E2740 Unspecified adrenocortical insufficiency: Secondary | ICD-10-CM | POA: Diagnosis not present

## 2019-02-21 DIAGNOSIS — F553 Abuse of steroids or hormones: Secondary | ICD-10-CM | POA: Diagnosis not present

## 2019-02-21 NOTE — Anesthesia Preprocedure Evaluation (Addendum)
Anesthesia Evaluation  Patient identified by MRN, date of birth, ID band Patient awake    Reviewed: Allergy & Precautions, NPO status , Patient's Chart, lab work & pertinent test results  History of Anesthesia Complications Negative for: history of anesthetic complications  Airway Mallampati: I   Neck ROM: Full    Dental  (+)    Pulmonary sleep apnea and Continuous Positive Airway Pressure Ventilation ,    Pulmonary exam normal breath sounds clear to auscultation       Cardiovascular hypertension, Normal cardiovascular exam Rhythm:Regular Rate:Normal     Neuro/Psych PSYCHIATRIC DISORDERS Anxiety Depression negative neurological ROS     GI/Hepatic negative GI ROS,   Endo/Other  negative endocrine ROS  Renal/GU negative Renal ROS     Musculoskeletal  (+) Arthritis ,   Abdominal   Peds  Hematology negative hematology ROS (+)   Anesthesia Other Findings   Reproductive/Obstetrics                             Anesthesia Physical  Anesthesia Plan  ASA: II  Anesthesia Plan: MAC   Post-op Pain Management:    Induction: Intravenous  PONV Risk Score and Plan: 1 and Midazolam, TIVA and Treatment may vary due to age or medical condition  Airway Management Planned: Natural Airway  Additional Equipment:   Intra-op Plan:   Post-operative Plan:   Informed Consent: I have reviewed the patients History and Physical, chart, labs and discussed the procedure including the risks, benefits and alternatives for the proposed anesthesia with the patient or authorized representative who has indicated his/her understanding and acceptance.       Plan Discussed with: CRNA  Anesthesia Plan Comments:         Anesthesia Quick Evaluation  

## 2019-02-21 NOTE — Discharge Instructions (Signed)

## 2019-02-22 ENCOUNTER — Ambulatory Visit: Payer: BC Managed Care – PPO | Admitting: Anesthesiology

## 2019-02-22 ENCOUNTER — Encounter
Admission: RE | Disposition: A | Discharge status: Home / Self Care | Payer: Self-pay | Source: Home / Self Care | Attending: Ophthalmology

## 2019-02-22 ENCOUNTER — Ambulatory Visit
Admission: RE | Admit: 2019-02-22 | Discharge: 2019-02-22 | Disposition: A | Discharge status: Home / Self Care | Payer: BC Managed Care – PPO | Attending: Ophthalmology | Admitting: Ophthalmology

## 2019-02-22 ENCOUNTER — Other Ambulatory Visit: Payer: Self-pay

## 2019-02-22 DIAGNOSIS — F419 Anxiety disorder, unspecified: Secondary | ICD-10-CM | POA: Diagnosis not present

## 2019-02-22 DIAGNOSIS — F329 Major depressive disorder, single episode, unspecified: Secondary | ICD-10-CM | POA: Diagnosis not present

## 2019-02-22 DIAGNOSIS — Z8614 Personal history of Methicillin resistant Staphylococcus aureus infection: Secondary | ICD-10-CM | POA: Insufficient documentation

## 2019-02-22 DIAGNOSIS — G4730 Sleep apnea, unspecified: Secondary | ICD-10-CM | POA: Insufficient documentation

## 2019-02-22 DIAGNOSIS — M1990 Unspecified osteoarthritis, unspecified site: Secondary | ICD-10-CM | POA: Insufficient documentation

## 2019-02-22 DIAGNOSIS — Z881 Allergy status to other antibiotic agents status: Secondary | ICD-10-CM | POA: Diagnosis not present

## 2019-02-22 DIAGNOSIS — E7800 Pure hypercholesterolemia, unspecified: Secondary | ICD-10-CM | POA: Diagnosis not present

## 2019-02-22 DIAGNOSIS — H2512 Age-related nuclear cataract, left eye: Secondary | ICD-10-CM | POA: Diagnosis not present

## 2019-02-22 DIAGNOSIS — I10 Essential (primary) hypertension: Secondary | ICD-10-CM | POA: Diagnosis not present

## 2019-02-22 DIAGNOSIS — H25812 Combined forms of age-related cataract, left eye: Secondary | ICD-10-CM | POA: Diagnosis not present

## 2019-02-22 HISTORY — DX: Dental restoration status: Z98.811

## 2019-02-22 HISTORY — DX: Methicillin resistant Staphylococcus aureus infection, unspecified site: A49.02

## 2019-02-22 HISTORY — PX: CATARACT EXTRACTION W/PHACO: SHX586

## 2019-02-22 HISTORY — DX: Sleep apnea, unspecified: G47.30

## 2019-02-22 HISTORY — DX: Shortness of breath: R06.02

## 2019-02-22 SURGERY — PHACOEMULSIFICATION, CATARACT, WITH IOL INSERTION
Anesthesia: Monitor Anesthesia Care | Site: Eye | Laterality: Left

## 2019-02-22 MED ORDER — EPINEPHRINE PF 1 MG/ML IJ SOLN
INTRAOCULAR | Status: DC | PRN
  Administered 2019-02-22: 77 mL via OPHTHALMIC

## 2019-02-22 MED ORDER — ACETAMINOPHEN 160 MG/5ML PO SOLN: 325.0000 mg | ORAL | Status: DC | PRN

## 2019-02-22 MED ORDER — FENTANYL CITRATE (PF) 100 MCG/2ML IJ SOLN
INTRAMUSCULAR | Status: DC | PRN
  Administered 2019-02-22 (×2): 50 ug via INTRAVENOUS

## 2019-02-22 MED ORDER — LIDOCAINE HCL (PF) 2 % IJ SOLN
INTRAOCULAR | Status: DC | PRN
  Administered 2019-02-22: 2 mL

## 2019-02-22 MED ORDER — ARMC OPHTHALMIC DILATING DROPS
1.0000 "application " | OPHTHALMIC | Status: DC | PRN
  Administered 2019-02-22 (×3): 1 via OPHTHALMIC

## 2019-02-22 MED ORDER — MIDAZOLAM HCL 2 MG/2ML IJ SOLN
INTRAMUSCULAR | Status: DC | PRN
  Administered 2019-02-22: 2 mg via INTRAVENOUS

## 2019-02-22 MED ORDER — ACETAMINOPHEN 325 MG PO TABS: 650.0000 mg | ORAL_TABLET | Freq: Once | ORAL | Status: DC | PRN

## 2019-02-22 MED ORDER — ONDANSETRON HCL 4 MG/2ML IJ SOLN: 4.0000 mg | Freq: Once | INTRAMUSCULAR | Status: DC | PRN

## 2019-02-22 MED ORDER — MOXIFLOXACIN HCL 0.5 % OP SOLN
OPHTHALMIC | Status: DC | PRN
  Administered 2019-02-22: 0.2 mL via OPHTHALMIC

## 2019-02-22 MED ORDER — TETRACAINE HCL 0.5 % OP SOLN
1.0000 [drp] | OPHTHALMIC | Status: DC | PRN
  Administered 2019-02-22 (×3): 1 [drp] via OPHTHALMIC

## 2019-02-22 MED ORDER — BRIMONIDINE TARTRATE-TIMOLOL 0.2-0.5 % OP SOLN
OPHTHALMIC | Status: DC | PRN
  Administered 2019-02-22: 1 [drp] via OPHTHALMIC

## 2019-02-22 MED ORDER — NA CHONDROIT SULF-NA HYALURON 40-17 MG/ML IO SOLN
INTRAOCULAR | Status: DC | PRN
  Administered 2019-02-22: 1 mL via INTRAOCULAR

## 2019-02-22 SURGICAL SUPPLY — 20 items
CANNULA ANT/CHMB 27GA (MISCELLANEOUS) ×4 IMPLANT
CARTRIDGE ABBOTT (MISCELLANEOUS) ×2 IMPLANT
GLOVE SURG LX 8.0 MICRO (GLOVE) ×1
GLOVE SURG LX STRL 8.0 MICRO (GLOVE) ×1 IMPLANT
GLOVE SURG TRIUMPH 8.0 PF LTX (GLOVE) ×2 IMPLANT
GOWN STRL REUS W/ TWL LRG LVL3 (GOWN DISPOSABLE) ×2 IMPLANT
GOWN STRL REUS W/TWL LRG LVL3 (GOWN DISPOSABLE) ×2
LENS IOL IQ PAN TRC 60 12.0 ×1 IMPLANT
LENS IOL PANOPTIX TORIC 12.0 ×2 IMPLANT
MARKER SKIN DUAL TIP RULER LAB (MISCELLANEOUS) ×2 IMPLANT
NDL RETROBULBAR .5 NSTRL (NEEDLE) ×2 IMPLANT
NEEDLE FILTER BLUNT 18X 1/2SAF (NEEDLE) ×1
NEEDLE FILTER BLUNT 18X1 1/2 (NEEDLE) ×1 IMPLANT
PACK EYE AFTER SURG (MISCELLANEOUS) ×2 IMPLANT
PACK OPTHALMIC (MISCELLANEOUS) ×2 IMPLANT
PACK PORFILIO (MISCELLANEOUS) ×2 IMPLANT
SYR 3ML LL SCALE MARK (SYRINGE) ×2 IMPLANT
SYR TB 1ML LUER SLIP (SYRINGE) ×2 IMPLANT
WATER STERILE IRR 250ML POUR (IV SOLUTION) ×2 IMPLANT
WIPE NON LINTING 3.25X3.25 (MISCELLANEOUS) ×2 IMPLANT

## 2019-02-22 NOTE — Anesthesia Postprocedure Evaluation (Signed)
Anesthesia Post Note  Patient: Darryl Chaney  Procedure(s) Performed: CATARACT EXTRACTION PHACO AND INTRAOCULAR LENS PLACEMENT (IOC) LEFT VIVITY TORIC LENS 0.74,    00:12.3 (Left Eye)     Patient location during evaluation: PACU Anesthesia Type: MAC Level of consciousness: awake and alert, oriented and patient cooperative Pain management: pain level controlled Vital Signs Assessment: post-procedure vital signs reviewed and stable Respiratory status: spontaneous breathing, nonlabored ventilation and respiratory function stable Cardiovascular status: blood pressure returned to baseline and stable Postop Assessment: adequate PO intake Anesthetic complications: no    Darrin Nipper

## 2019-02-22 NOTE — Transfer of Care (Signed)
Immediate Anesthesia Transfer of Care Note  Patient: Darryl Chaney  Procedure(s) Performed: CATARACT EXTRACTION PHACO AND INTRAOCULAR LENS PLACEMENT (IOC) LEFT VIVITY TORIC LENS 0.74,    00:12.3 (Left Eye)  Patient Location: PACU  Anesthesia Type: MAC  Level of Consciousness: awake, alert  and patient cooperative  Airway and Oxygen Therapy: Patient Spontanous Breathing and Patient connected to supplemental oxygen  Post-op Assessment: Post-op Vital signs reviewed, Patient's Cardiovascular Status Stable, Respiratory Function Stable, Patent Airway and No signs of Nausea or vomiting  Post-op Vital Signs: Reviewed and stable  Complications: No apparent anesthesia complications

## 2019-02-22 NOTE — Anesthesia Procedure Notes (Signed)
Procedure Name: MAC Date/Time: 02/22/2019 1:16 PM Performed by: Georga Bora, CRNA Pre-anesthesia Checklist: Patient identified, Suction available, Patient being monitored, Emergency Drugs available and Timeout performed Patient Re-evaluated:Patient Re-evaluated prior to induction Oxygen Delivery Method: Nasal cannula

## 2019-02-22 NOTE — H&P (Signed)
All labs reviewed. Abnormal studies sent to patients PCP when indicated.  Previous H&P reviewed, patient examined, there are NO CHANGES.  Darryl Porfilio12/8/20201:06 PM

## 2019-02-22 NOTE — Op Note (Signed)
PREOPERATIVE DIAGNOSIS:  Nuclear sclerotic cataract of the left eye.   POSTOPERATIVE DIAGNOSIS:  Nuclear sclerotic cataract of the left eye.   OPERATIVE PROCEDURE: Procedure(s): CATARACT EXTRACTION PHACO AND INTRAOCULAR LENS PLACEMENT (IOC) LEFT VIVITY TORIC LENS 0.74,    00:12.3   SURGEON:  Birder Robson, MD.   ANESTHESIA: 1.      Managed anesthesia care. 2.     0.42ml os Shugarcaine was instilled following the paracentesis [email or web address]   COMPLICATIONS:  None.   TECHNIQUE:   Stop and chop    DESCRIPTION OF PROCEDURE:  The patient was examined and consented in the preoperative holding area where the aforementioned topical anesthesia was applied to the left eye.  The patient was brought back to the Operating Room where he was sat upright on the gurney and given a target to fixate upon while the eye was marked at the 3:00 and 9:00 position.  The patient was then reclined on the operating table.  The eye was prepped and draped in the usual sterile ophthalmic fashion and a lid speculum was placed. A paracentesis was created with the side port blade and the anterior chamber was filled with viscoelastic. A near clear corneal incision was performed with the steel keratome. A continuous curvilinear capsulorrhexis was performed with a cystotome followed by the capsulorrhexis forceps. Hydrodissection and hydrodelineation were carried out with BSS on a blunt cannula. The lens was removed in a stop and chop technique and the remaining cortical material was removed with the irrigation-aspiration handpiece. The eye was inflated with viscoelastic and the TFNT60 lens was placed in the eye and rotated to within a few degrees of the predetermined orientation.  The remaining viscoelastic was removed from the eye.  The Sinskey hook was used to rotate the toric lens into its final resting place at 074 degrees.  0.1 ml of Vigamox was placed in the anterior chamber. The eye was inflated to a physiologic pressure and  found to be watertight.  The eye was dressed with Combigan. The patient was given protective glasses to wear throughout the day and a shield with which to sleep tonight. The patient was also given drops with which to begin a drop regimen today and will follow-up with me in one day. Implant Name Type Inv. Item Serial No. Manufacturer Lot No. LRB No. Used Action  AcrySof IQ PanOptix Toric Intraocular Lens  [id number] ALCON  Left 1 Implanted   Procedure(s) with comments: CATARACT EXTRACTION PHACO AND INTRAOCULAR LENS PLACEMENT (IOC) LEFT VIVITY TORIC LENS 0.74,    00:12.3 (Left) - Sleep apnea-CPAP  Electronically signed: Birder Robson 12/8/20201:49 PM

## 2019-02-23 ENCOUNTER — Encounter: Payer: Self-pay | Admitting: Ophthalmology

## 2019-02-25 DIAGNOSIS — T4145XA Adverse effect of unspecified anesthetic, initial encounter: Secondary | ICD-10-CM | POA: Diagnosis not present

## 2019-02-25 DIAGNOSIS — E2740 Unspecified adrenocortical insufficiency: Secondary | ICD-10-CM | POA: Diagnosis not present

## 2019-02-25 DIAGNOSIS — F553 Abuse of steroids or hormones: Secondary | ICD-10-CM | POA: Diagnosis not present

## 2019-02-25 DIAGNOSIS — E230 Hypopituitarism: Secondary | ICD-10-CM | POA: Diagnosis not present

## 2019-02-25 DIAGNOSIS — I517 Cardiomegaly: Secondary | ICD-10-CM | POA: Diagnosis not present

## 2019-02-25 DIAGNOSIS — G4739 Other sleep apnea: Secondary | ICD-10-CM | POA: Diagnosis not present

## 2019-02-25 DIAGNOSIS — T4145XD Adverse effect of unspecified anesthetic, subsequent encounter: Secondary | ICD-10-CM | POA: Diagnosis not present

## 2019-02-28 DIAGNOSIS — Z1159 Encounter for screening for other viral diseases: Secondary | ICD-10-CM | POA: Diagnosis not present

## 2019-03-03 DIAGNOSIS — K635 Polyp of colon: Secondary | ICD-10-CM | POA: Diagnosis not present

## 2019-03-03 DIAGNOSIS — Z1211 Encounter for screening for malignant neoplasm of colon: Secondary | ICD-10-CM | POA: Diagnosis not present

## 2019-03-03 DIAGNOSIS — D123 Benign neoplasm of transverse colon: Secondary | ICD-10-CM | POA: Diagnosis not present

## 2019-03-03 DIAGNOSIS — D122 Benign neoplasm of ascending colon: Secondary | ICD-10-CM | POA: Diagnosis not present

## 2019-03-03 DIAGNOSIS — K648 Other hemorrhoids: Secondary | ICD-10-CM | POA: Diagnosis not present

## 2019-03-04 DIAGNOSIS — H2511 Age-related nuclear cataract, right eye: Secondary | ICD-10-CM | POA: Diagnosis not present

## 2019-03-04 DIAGNOSIS — E7800 Pure hypercholesterolemia, unspecified: Secondary | ICD-10-CM | POA: Diagnosis not present

## 2019-03-07 ENCOUNTER — Encounter: Payer: Self-pay | Admitting: Ophthalmology

## 2019-03-07 ENCOUNTER — Other Ambulatory Visit: Payer: Self-pay

## 2019-03-09 NOTE — Discharge Instructions (Signed)

## 2019-03-10 ENCOUNTER — Other Ambulatory Visit: Admission: RE | Admit: 2019-03-10 | Payer: BC Managed Care – PPO | Source: Ambulatory Visit

## 2019-03-14 ENCOUNTER — Other Ambulatory Visit
Admission: RE | Admit: 2019-03-14 | Discharge: 2019-03-14 | Disposition: A | Discharge status: Home / Self Care | Payer: BC Managed Care – PPO | Source: Ambulatory Visit | Attending: Ophthalmology | Admitting: Ophthalmology

## 2019-03-14 ENCOUNTER — Other Ambulatory Visit: Payer: Self-pay

## 2019-03-14 DIAGNOSIS — Z01812 Encounter for preprocedural laboratory examination: Secondary | ICD-10-CM | POA: Diagnosis not present

## 2019-03-14 DIAGNOSIS — Z20828 Contact with and (suspected) exposure to other viral communicable diseases: Secondary | ICD-10-CM | POA: Insufficient documentation

## 2019-03-15 ENCOUNTER — Encounter: Payer: Self-pay | Admitting: Ophthalmology

## 2019-03-15 ENCOUNTER — Ambulatory Visit: Payer: BC Managed Care – PPO | Admitting: Anesthesiology

## 2019-03-15 ENCOUNTER — Encounter
Admission: RE | Disposition: A | Discharge status: Home / Self Care | Payer: Self-pay | Source: Home / Self Care | Attending: Ophthalmology

## 2019-03-15 ENCOUNTER — Ambulatory Visit
Admission: RE | Admit: 2019-03-15 | Discharge: 2019-03-15 | Disposition: A | Discharge status: Home / Self Care | Payer: BC Managed Care – PPO | Attending: Ophthalmology | Admitting: Ophthalmology

## 2019-03-15 DIAGNOSIS — F419 Anxiety disorder, unspecified: Secondary | ICD-10-CM | POA: Diagnosis not present

## 2019-03-15 DIAGNOSIS — G4730 Sleep apnea, unspecified: Secondary | ICD-10-CM | POA: Insufficient documentation

## 2019-03-15 DIAGNOSIS — Z881 Allergy status to other antibiotic agents status: Secondary | ICD-10-CM | POA: Diagnosis not present

## 2019-03-15 DIAGNOSIS — F329 Major depressive disorder, single episode, unspecified: Secondary | ICD-10-CM | POA: Insufficient documentation

## 2019-03-15 DIAGNOSIS — Z8614 Personal history of Methicillin resistant Staphylococcus aureus infection: Secondary | ICD-10-CM | POA: Diagnosis not present

## 2019-03-15 DIAGNOSIS — E7800 Pure hypercholesterolemia, unspecified: Secondary | ICD-10-CM | POA: Insufficient documentation

## 2019-03-15 DIAGNOSIS — I10 Essential (primary) hypertension: Secondary | ICD-10-CM | POA: Diagnosis not present

## 2019-03-15 DIAGNOSIS — H25811 Combined forms of age-related cataract, right eye: Secondary | ICD-10-CM | POA: Diagnosis not present

## 2019-03-15 DIAGNOSIS — M1990 Unspecified osteoarthritis, unspecified site: Secondary | ICD-10-CM | POA: Insufficient documentation

## 2019-03-15 DIAGNOSIS — H2511 Age-related nuclear cataract, right eye: Secondary | ICD-10-CM | POA: Insufficient documentation

## 2019-03-15 DIAGNOSIS — Z9842 Cataract extraction status, left eye: Secondary | ICD-10-CM | POA: Insufficient documentation

## 2019-03-15 HISTORY — DX: Other complications of anesthesia, initial encounter: T88.59XA

## 2019-03-15 HISTORY — PX: CATARACT EXTRACTION W/PHACO: SHX586

## 2019-03-15 LAB — SARS CORONAVIRUS 2 (TAT 6-24 HRS): SARS Coronavirus 2: NEGATIVE

## 2019-03-15 SURGERY — PHACOEMULSIFICATION, CATARACT, WITH IOL INSERTION
Anesthesia: Monitor Anesthesia Care | Site: Eye | Laterality: Right

## 2019-03-15 MED ORDER — MOXIFLOXACIN HCL 0.5 % OP SOLN
OPHTHALMIC | Status: DC | PRN
  Administered 2019-03-15: 0.2 mL via OPHTHALMIC

## 2019-03-15 MED ORDER — LACTATED RINGERS IV SOLN: INTRAVENOUS | Status: DC

## 2019-03-15 MED ORDER — NA CHONDROIT SULF-NA HYALURON 40-17 MG/ML IO SOLN
INTRAOCULAR | Status: DC | PRN
  Administered 2019-03-15: 1 mL via INTRAOCULAR

## 2019-03-15 MED ORDER — FENTANYL CITRATE (PF) 100 MCG/2ML IJ SOLN
INTRAMUSCULAR | Status: DC | PRN
  Administered 2019-03-15: 100 ug via INTRAVENOUS

## 2019-03-15 MED ORDER — TETRACAINE HCL 0.5 % OP SOLN
1.0000 [drp] | OPHTHALMIC | Status: DC | PRN
  Administered 2019-03-15 (×3): 1 [drp] via OPHTHALMIC

## 2019-03-15 MED ORDER — BRIMONIDINE TARTRATE-TIMOLOL 0.2-0.5 % OP SOLN
OPHTHALMIC | Status: DC | PRN
  Administered 2019-03-15: 1 [drp] via OPHTHALMIC

## 2019-03-15 MED ORDER — MIDAZOLAM HCL 2 MG/2ML IJ SOLN
INTRAMUSCULAR | Status: DC | PRN
  Administered 2019-03-15: 2 mg via INTRAVENOUS

## 2019-03-15 MED ORDER — ARMC OPHTHALMIC DILATING DROPS
1.0000 "application " | OPHTHALMIC | Status: DC | PRN
  Administered 2019-03-15 (×3): 1 via OPHTHALMIC

## 2019-03-15 MED ORDER — EPINEPHRINE PF 1 MG/ML IJ SOLN
INTRAOCULAR | Status: DC | PRN
  Administered 2019-03-15: 41 mL via OPHTHALMIC

## 2019-03-15 MED ORDER — LIDOCAINE HCL (PF) 2 % IJ SOLN
INTRAOCULAR | Status: DC | PRN
  Administered 2019-03-15: 09:00:00 2 mL

## 2019-03-15 SURGICAL SUPPLY — 22 items
CANNULA ANT/CHMB 27G (MISCELLANEOUS) ×2 IMPLANT
CANNULA ANT/CHMB 27GA (MISCELLANEOUS) ×4 IMPLANT
GLOVE SURG LX 8.0 MICRO (GLOVE) ×1
GLOVE SURG LX STRL 8.0 MICRO (GLOVE) ×1 IMPLANT
GLOVE SURG TRIUMPH 8.0 PF LTX (GLOVE) ×2 IMPLANT
GOWN STRL REUS W/ TWL LRG LVL3 (GOWN DISPOSABLE) ×2 IMPLANT
GOWN STRL REUS W/TWL LRG LVL3 (GOWN DISPOSABLE) ×2
LENS IOL IQ PAN TRC 60 14.0 IMPLANT
LENS IOL PANOP TORIC 60 14.0 ×1 IMPLANT
LENS IOL PANOPTIX TORIC 14.0 ×1 IMPLANT
MARKER SKIN DUAL TIP RULER LAB (MISCELLANEOUS) ×2 IMPLANT
NDL FILTER BLUNT 18X1 1/2 (NEEDLE) ×1 IMPLANT
NDL RETROBULBAR .5 NSTRL (NEEDLE) ×2 IMPLANT
NEEDLE FILTER BLUNT 18X 1/2SAF (NEEDLE) ×1
NEEDLE FILTER BLUNT 18X1 1/2 (NEEDLE) ×1 IMPLANT
PACK EYE AFTER SURG (MISCELLANEOUS) ×2 IMPLANT
PACK OPTHALMIC (MISCELLANEOUS) ×2 IMPLANT
PACK PORFILIO (MISCELLANEOUS) ×2 IMPLANT
SYR 3ML LL SCALE MARK (SYRINGE) ×2 IMPLANT
SYR TB 1ML LUER SLIP (SYRINGE) ×2 IMPLANT
WATER STERILE IRR 250ML POUR (IV SOLUTION) ×2 IMPLANT
WIPE NON LINTING 3.25X3.25 (MISCELLANEOUS) ×2 IMPLANT

## 2019-03-15 NOTE — Anesthesia Procedure Notes (Signed)
Procedure Name: MAC Performed by: Nihaal Friesen, CRNA Pre-anesthesia Checklist: Patient identified, Emergency Drugs available, Suction available, Timeout performed and Patient being monitored Patient Re-evaluated:Patient Re-evaluated prior to induction Oxygen Delivery Method: Nasal cannula Placement Confirmation: positive ETCO2       

## 2019-03-15 NOTE — Op Note (Signed)
PREOPERATIVE DIAGNOSIS:  Nuclear sclerotic cataract of the right eye.   POSTOPERATIVE DIAGNOSIS:  Nuclear sclerotic cataract of the right eye.   OPERATIVE PROCEDURE: Procedure(s): CATARACT EXTRACTION PHACO AND INTRAOCULAR LENS PLACEMENT (IOC) RIGHT PANOPTIX TORIC 1.34 00:17.1   SURGEON:  Birder Robson, MD.   ANESTHESIA: 1.      Managed anesthesia care. 2.     0.35ml of Shugarcaine was instilled following the paracentesis  Anesthesiologist: Veda Canning, MD CRNA: Cameron Ali, CRNA  COMPLICATIONS:  None.   TECHNIQUE:   Stop and chop    DESCRIPTION OF PROCEDURE:  The patient was examined and consented in the preoperative holding area where the aforementioned topical anesthesia was applied to the right eye.  The patient was brought back to the Operating Room where he was sat upright on the gurney and given a target to fixate upon while the eye was marked at the 3:00 and 9:00 position.  The patient was then reclined on the operating table.  The eye was prepped and draped in the usual sterile ophthalmic fashion and a lid speculum was placed. A paracentesis was created with the side port blade and the anterior chamber was filled with viscoelastic. A near clear corneal incision was performed with the steel keratome. A continuous curvilinear capsulorrhexis was performed with a cystotome followed by the capsulorrhexis forceps. Hydrodissection and hydrodelineation were carried out with BSS on a blunt cannula. The lens was removed in a stop and chop technique and the remaining cortical material was removed with the irrigation-aspiration handpiece. The eye was inflated with viscoelastic and the PANOPTIX  lens  was placed in the eye and rotated to within a few degrees of the predetermined orientation.  The remaining viscoelastic was removed from the eye.  The Sinskey hook was used to rotate the toric lens into its final resting place at 095 degrees.  The eye was inflated to a physiologic pressure and found  to be watertight. 0.68ml of Vigamox was placed in the anterior chamber.  The eye was dressed with Combigan. The patient was given protective glasses to wear throughout the day and a shield with which to sleep tonight. The patient was also given drops with which to begin a drop regimen today and will follow-up with me in one day. Implant Name Type Inv. Item Serial No. Manufacturer Lot No. LRB No. Used Action  AcrySof IQ PanOptix Toric IOL Intraocular Lens  [id number] ALCON  Right 1 Implanted   Procedure(s): CATARACT EXTRACTION PHACO AND INTRAOCULAR LENS PLACEMENT (IOC) RIGHT PANOPTIX TORIC 1.34 00:17.1 (Right)  Electronically signed: Calyx Hawker 03/15/2019 9:19 AM

## 2019-03-15 NOTE — H&P (Signed)
All labs reviewed. Abnormal studies sent to patients PCP when indicated.  Previous H&P reviewed, patient examined, there are NO CHANGES.  Terrell Porfilio12/29/20208:49 AM

## 2019-03-15 NOTE — Anesthesia Postprocedure Evaluation (Signed)
Anesthesia Post Note  Patient: Darryl Chaney  Procedure(s) Performed: CATARACT EXTRACTION PHACO AND INTRAOCULAR LENS PLACEMENT (IOC) RIGHT PANOPTIX TORIC 1.34 00:17.1 (Right Eye)     Patient location during evaluation: PACU Anesthesia Type: MAC Level of consciousness: awake Pain management: pain level controlled Vital Signs Assessment: post-procedure vital signs reviewed and stable Respiratory status: respiratory function stable Cardiovascular status: stable Postop Assessment: no apparent nausea or vomiting Anesthetic complications: no    Veda Canning

## 2019-03-15 NOTE — Transfer of Care (Signed)
Immediate Anesthesia Transfer of Care Note  Patient: Darryl Chaney  Procedure(s) Performed: CATARACT EXTRACTION PHACO AND INTRAOCULAR LENS PLACEMENT (IOC) RIGHT PANOPTIX TORIC (Right Eye)  Patient Location: PACU  Anesthesia Type: MAC  Level of Consciousness: awake, alert  and patient cooperative  Airway and Oxygen Therapy: Patient Spontanous Breathing and Patient connected to supplemental oxygen  Post-op Assessment: Post-op Vital signs reviewed, Patient's Cardiovascular Status Stable, Respiratory Function Stable, Patent Airway and No signs of Nausea or vomiting  Post-op Vital Signs: Reviewed and stable  Complications: No apparent anesthesia complications

## 2019-03-15 NOTE — Anesthesia Preprocedure Evaluation (Signed)
Anesthesia Evaluation  Patient identified by MRN, date of birth, ID band Patient awake    Reviewed: Allergy & Precautions, NPO status , Patient's Chart, lab work & pertinent test results  History of Anesthesia Complications Negative for: history of anesthetic complications  Airway Mallampati: I   Neck ROM: Full    Dental  (+)    Pulmonary sleep apnea and Continuous Positive Airway Pressure Ventilation ,    Pulmonary exam normal breath sounds clear to auscultation       Cardiovascular hypertension, Normal cardiovascular exam Rhythm:Regular Rate:Normal     Neuro/Psych PSYCHIATRIC DISORDERS Anxiety Depression negative neurological ROS     GI/Hepatic negative GI ROS,   Endo/Other  negative endocrine ROS  Renal/GU negative Renal ROS     Musculoskeletal  (+) Arthritis ,   Abdominal   Peds  Hematology negative hematology ROS (+)   Anesthesia Other Findings   Reproductive/Obstetrics                             Anesthesia Physical  Anesthesia Plan  ASA: II  Anesthesia Plan: MAC   Post-op Pain Management:    Induction: Intravenous  PONV Risk Score and Plan: 1 and Midazolam, TIVA and Treatment may vary due to age or medical condition  Airway Management Planned: Natural Airway  Additional Equipment:   Intra-op Plan:   Post-operative Plan:   Informed Consent: I have reviewed the patients History and Physical, chart, labs and discussed the procedure including the risks, benefits and alternatives for the proposed anesthesia with the patient or authorized representative who has indicated his/her understanding and acceptance.       Plan Discussed with: CRNA  Anesthesia Plan Comments:         Anesthesia Quick Evaluation

## 2019-03-16 ENCOUNTER — Encounter: Payer: Self-pay | Admitting: *Deleted

## 2019-06-06 DIAGNOSIS — G4733 Obstructive sleep apnea (adult) (pediatric): Secondary | ICD-10-CM | POA: Diagnosis not present

## 2019-07-15 ENCOUNTER — Other Ambulatory Visit: Payer: Self-pay | Admitting: Geriatric Medicine

## 2019-07-15 ENCOUNTER — Ambulatory Visit
Admission: RE | Admit: 2019-07-15 | Discharge: 2019-07-15 | Disposition: A | Discharge status: Home / Self Care | Payer: Self-pay | Source: Ambulatory Visit | Attending: Geriatric Medicine | Admitting: Geriatric Medicine

## 2019-07-15 DIAGNOSIS — M25512 Pain in left shoulder: Secondary | ICD-10-CM | POA: Diagnosis not present

## 2019-07-15 DIAGNOSIS — R52 Pain, unspecified: Secondary | ICD-10-CM

## 2019-07-15 DIAGNOSIS — J40 Bronchitis, not specified as acute or chronic: Secondary | ICD-10-CM | POA: Diagnosis not present

## 2019-09-01 DIAGNOSIS — R222 Localized swelling, mass and lump, trunk: Secondary | ICD-10-CM | POA: Diagnosis not present

## 2019-09-01 DIAGNOSIS — N529 Male erectile dysfunction, unspecified: Secondary | ICD-10-CM | POA: Diagnosis not present

## 2019-09-16 DIAGNOSIS — S76111A Strain of right quadriceps muscle, fascia and tendon, initial encounter: Secondary | ICD-10-CM | POA: Diagnosis not present

## 2019-10-14 DIAGNOSIS — H26492 Other secondary cataract, left eye: Secondary | ICD-10-CM | POA: Diagnosis not present

## 2019-10-14 DIAGNOSIS — H26491 Other secondary cataract, right eye: Secondary | ICD-10-CM | POA: Diagnosis not present

## 2019-10-21 DIAGNOSIS — H26492 Other secondary cataract, left eye: Secondary | ICD-10-CM | POA: Diagnosis not present

## 2019-11-03 DIAGNOSIS — N529 Male erectile dysfunction, unspecified: Secondary | ICD-10-CM | POA: Diagnosis not present

## 2019-11-03 DIAGNOSIS — I10 Essential (primary) hypertension: Secondary | ICD-10-CM | POA: Diagnosis not present

## 2019-11-03 DIAGNOSIS — E230 Hypopituitarism: Secondary | ICD-10-CM | POA: Diagnosis not present

## 2019-11-03 DIAGNOSIS — E559 Vitamin D deficiency, unspecified: Secondary | ICD-10-CM | POA: Diagnosis not present

## 2019-11-03 DIAGNOSIS — Z125 Encounter for screening for malignant neoplasm of prostate: Secondary | ICD-10-CM | POA: Diagnosis not present

## 2019-11-03 DIAGNOSIS — D751 Secondary polycythemia: Secondary | ICD-10-CM | POA: Diagnosis not present

## 2019-11-10 DIAGNOSIS — M76892 Other specified enthesopathies of left lower limb, excluding foot: Secondary | ICD-10-CM | POA: Diagnosis not present

## 2019-11-10 DIAGNOSIS — M76891 Other specified enthesopathies of right lower limb, excluding foot: Secondary | ICD-10-CM | POA: Diagnosis not present

## 2019-12-07 DIAGNOSIS — R222 Localized swelling, mass and lump, trunk: Secondary | ICD-10-CM | POA: Diagnosis not present

## 2020-01-19 DIAGNOSIS — Z01818 Encounter for other preprocedural examination: Secondary | ICD-10-CM | POA: Diagnosis not present

## 2020-01-23 DIAGNOSIS — D1801 Hemangioma of skin and subcutaneous tissue: Secondary | ICD-10-CM | POA: Diagnosis not present

## 2020-01-23 DIAGNOSIS — D1800 Hemangioma unspecified site: Secondary | ICD-10-CM | POA: Diagnosis not present

## 2020-01-23 DIAGNOSIS — D481 Neoplasm of uncertain behavior of connective and other soft tissue: Secondary | ICD-10-CM | POA: Diagnosis not present

## 2020-02-20 DIAGNOSIS — I10 Essential (primary) hypertension: Secondary | ICD-10-CM | POA: Diagnosis not present

## 2020-02-20 DIAGNOSIS — E559 Vitamin D deficiency, unspecified: Secondary | ICD-10-CM | POA: Diagnosis not present

## 2020-02-20 DIAGNOSIS — D751 Secondary polycythemia: Secondary | ICD-10-CM | POA: Diagnosis not present

## 2020-02-20 DIAGNOSIS — F339 Major depressive disorder, recurrent, unspecified: Secondary | ICD-10-CM | POA: Diagnosis not present

## 2020-02-20 DIAGNOSIS — Z0000 Encounter for general adult medical examination without abnormal findings: Secondary | ICD-10-CM | POA: Diagnosis not present

## 2020-02-20 DIAGNOSIS — E230 Hypopituitarism: Secondary | ICD-10-CM | POA: Diagnosis not present

## 2020-02-20 DIAGNOSIS — N529 Male erectile dysfunction, unspecified: Secondary | ICD-10-CM | POA: Diagnosis not present

## 2020-02-20 DIAGNOSIS — Z125 Encounter for screening for malignant neoplasm of prostate: Secondary | ICD-10-CM | POA: Diagnosis not present

## 2020-03-23 DIAGNOSIS — N529 Male erectile dysfunction, unspecified: Secondary | ICD-10-CM | POA: Diagnosis not present

## 2020-03-23 DIAGNOSIS — F339 Major depressive disorder, recurrent, unspecified: Secondary | ICD-10-CM | POA: Diagnosis not present

## 2020-03-23 DIAGNOSIS — I10 Essential (primary) hypertension: Secondary | ICD-10-CM | POA: Diagnosis not present

## 2020-03-23 DIAGNOSIS — M9904 Segmental and somatic dysfunction of sacral region: Secondary | ICD-10-CM | POA: Diagnosis not present

## 2020-03-23 DIAGNOSIS — M7542 Impingement syndrome of left shoulder: Secondary | ICD-10-CM | POA: Diagnosis not present

## 2020-03-23 DIAGNOSIS — E230 Hypopituitarism: Secondary | ICD-10-CM | POA: Diagnosis not present

## 2020-03-23 DIAGNOSIS — M9902 Segmental and somatic dysfunction of thoracic region: Secondary | ICD-10-CM | POA: Diagnosis not present

## 2020-03-23 DIAGNOSIS — M9903 Segmental and somatic dysfunction of lumbar region: Secondary | ICD-10-CM | POA: Diagnosis not present

## 2020-04-19 DIAGNOSIS — R0989 Other specified symptoms and signs involving the circulatory and respiratory systems: Secondary | ICD-10-CM | POA: Diagnosis not present

## 2020-04-26 DIAGNOSIS — Z20822 Contact with and (suspected) exposure to covid-19: Secondary | ICD-10-CM | POA: Diagnosis not present

## 2020-04-26 DIAGNOSIS — Z03818 Encounter for observation for suspected exposure to other biological agents ruled out: Secondary | ICD-10-CM | POA: Diagnosis not present

## 2020-04-26 DIAGNOSIS — J0140 Acute pansinusitis, unspecified: Secondary | ICD-10-CM | POA: Diagnosis not present

## 2020-04-26 DIAGNOSIS — J029 Acute pharyngitis, unspecified: Secondary | ICD-10-CM | POA: Diagnosis not present

## 2020-05-17 DIAGNOSIS — Z03818 Encounter for observation for suspected exposure to other biological agents ruled out: Secondary | ICD-10-CM | POA: Diagnosis not present

## 2020-05-17 DIAGNOSIS — Z20822 Contact with and (suspected) exposure to covid-19: Secondary | ICD-10-CM | POA: Diagnosis not present

## 2020-05-22 DIAGNOSIS — J029 Acute pharyngitis, unspecified: Secondary | ICD-10-CM | POA: Diagnosis not present

## 2020-06-03 ENCOUNTER — Emergency Department
Admission: EM | Admit: 2020-06-03 | Discharge: 2020-06-03 | Disposition: A | Discharge status: Home / Self Care | Payer: BC Managed Care – PPO | Attending: Emergency Medicine | Admitting: Emergency Medicine

## 2020-06-03 ENCOUNTER — Other Ambulatory Visit: Payer: Self-pay

## 2020-06-03 DIAGNOSIS — Y906 Blood alcohol level of 120-199 mg/100 ml: Secondary | ICD-10-CM | POA: Insufficient documentation

## 2020-06-03 DIAGNOSIS — F10929 Alcohol use, unspecified with intoxication, unspecified: Secondary | ICD-10-CM | POA: Diagnosis not present

## 2020-06-03 DIAGNOSIS — R112 Nausea with vomiting, unspecified: Secondary | ICD-10-CM | POA: Diagnosis not present

## 2020-06-03 DIAGNOSIS — F10120 Alcohol abuse with intoxication, uncomplicated: Secondary | ICD-10-CM | POA: Diagnosis not present

## 2020-06-03 DIAGNOSIS — I10 Essential (primary) hypertension: Secondary | ICD-10-CM | POA: Insufficient documentation

## 2020-06-03 DIAGNOSIS — R519 Headache, unspecified: Secondary | ICD-10-CM | POA: Diagnosis not present

## 2020-06-03 DIAGNOSIS — Z79899 Other long term (current) drug therapy: Secondary | ICD-10-CM | POA: Insufficient documentation

## 2020-06-03 DIAGNOSIS — F10920 Alcohol use, unspecified with intoxication, uncomplicated: Secondary | ICD-10-CM

## 2020-06-03 LAB — COMPREHENSIVE METABOLIC PANEL
ALT: 54 U/L — ABNORMAL HIGH (ref 0–44)
AST: 60 U/L — ABNORMAL HIGH (ref 15–41)
Albumin: 4.4 g/dL (ref 3.5–5.0)
Alkaline Phosphatase: 61 U/L (ref 38–126)
Anion gap: 12 (ref 5–15)
BUN: 18 mg/dL (ref 6–20)
CO2: 21 mmol/L — ABNORMAL LOW (ref 22–32)
Calcium: 8.6 mg/dL — ABNORMAL LOW (ref 8.9–10.3)
Chloride: 107 mmol/L (ref 98–111)
Creatinine, Ser: 1.56 mg/dL — ABNORMAL HIGH (ref 0.61–1.24)
GFR, Estimated: 53 mL/min — ABNORMAL LOW (ref >60)
Glucose, Bld: 120 mg/dL — ABNORMAL HIGH (ref 70–99)
Potassium: 3.2 mmol/L — ABNORMAL LOW (ref 3.5–5.1)
Sodium: 140 mmol/L (ref 135–145)
Total Bilirubin: 0.5 mg/dL (ref 0.3–1.2)
Total Protein: 7.7 g/dL (ref 6.5–8.1)

## 2020-06-03 LAB — CBC
HCT: 46.4 % (ref 39.0–52.0)
Hemoglobin: 14.6 g/dL (ref 13.0–17.0)
MCH: 25.7 pg — ABNORMAL LOW (ref 26.0–34.0)
MCHC: 31.5 g/dL (ref 30.0–36.0)
MCV: 81.5 fL (ref 80.0–100.0)
Platelets: 221 10*3/uL (ref 150–400)
RBC: 5.69 MIL/uL (ref 4.22–5.81)
RDW: 13.7 % (ref 11.5–15.5)
WBC: 8.5 10*3/uL (ref 4.0–10.5)
nRBC: 0 % (ref 0.0–0.2)

## 2020-06-03 LAB — URINE DRUG SCREEN, QUALITATIVE (ARMC ONLY)
Amphetamines, Ur Screen: NOT DETECTED
Barbiturates, Ur Screen: NOT DETECTED
Benzodiazepine, Ur Scrn: NOT DETECTED
Cannabinoid 50 Ng, Ur ~~LOC~~: NOT DETECTED
Cocaine Metabolite,Ur ~~LOC~~: NOT DETECTED
MDMA (Ecstasy)Ur Screen: NOT DETECTED
Methadone Scn, Ur: NOT DETECTED
Opiate, Ur Screen: NOT DETECTED
Phencyclidine (PCP) Ur S: NOT DETECTED
Tricyclic, Ur Screen: NOT DETECTED

## 2020-06-03 LAB — ETHANOL: Alcohol, Ethyl (B): 181 mg/dL — ABNORMAL HIGH (ref <10)

## 2020-06-03 MED ORDER — LACTATED RINGERS IV BOLUS
1000.0000 mL | Freq: Once | INTRAVENOUS | Status: AC
  Administered 2020-06-03: 1000 mL via INTRAVENOUS

## 2020-06-03 MED ORDER — ONDANSETRON HCL 4 MG/2ML IJ SOLN
4.0000 mg | INTRAMUSCULAR | Status: AC
  Administered 2020-06-03: 4 mg via INTRAVENOUS
  Filled 2020-06-03: qty 2

## 2020-06-03 NOTE — ED Triage Notes (Signed)
Pt states coming in because "I drank way to much." Pt noted to be spitting in emesis bag in triage.

## 2020-06-03 NOTE — ED Notes (Signed)
Pt alert and oriented. NAD. Given sandwich tray and drink.  No needs at this time. Unlabored. Skin warm dry and pink

## 2020-06-03 NOTE — ED Provider Notes (Signed)
[facility] Emergency Department Provider Note  ____________________________________________   Event Date/Time   First MD Initiated Contact with Patient 06/03/20 0301     (approximate)  I have reviewed the triage vital signs and the nursing notes.   HISTORY  Chief Complaint Alcohol Intoxication  Level 5 caveat:  history/ROS limited by acute intoxication  HPI Darryl Chaney is a 50 y.o. male with medical history as listed below which is noncontributory for tonight's visit.  He presents for evaluation of alcohol intoxication.  He said "I drank way too much".  He has been vomiting and felt so ill that he needed to come to the ED.  He said that he drank 7 or 8 shots of Jamison.  He has no pain.  He had no fall.  He has not lost consciousness.  His nausea is feeling better.  He said he just needed a safe place to be while he sobers up.  No history of assault.  Symptoms are acute in onset and severe.  Nothing particular makes it feel better or worse         Past Medical History:  Diagnosis Date  . Anxiety   . Complication of anesthesia    see anesthesia note [facility]-02/04/19  . Depression   . High cholesterol   . Hypertension    controlled  . MRSA (methicillin resistant Staphylococcus aureus)    6 yrs ago, no problems since  . Osteoarthritis    right shoulder  . S/p dental crown    may be a little loose  . Sleep apnea    CPAP  . SOB (shortness of breath)     Patient Active Problem List   Diagnosis Date Noted  . HYPERTENSION 06/08/2008  . BRONCHITIS 06/08/2008    Past Surgical History:  Procedure Laterality Date  . BICEPT TENODESIS Right 05/27/2018   Procedure: ,BICEP TENODESIS WITH EXTENSIVE DEBRIDEMENT;  Surgeon: Bjorn Pippin, MD;  Location: [facility];  Service: Orthopedics;  Laterality: Right;  . CATARACT EXTRACTION W/PHACO Left 02/22/2019   Procedure: CATARACT EXTRACTION PHACO AND INTRAOCULAR LENS PLACEMENT (IOC) LEFT VIVITY  TORIC LENS 0.74,    00:12.3;  Surgeon: Galen Manila, MD;  Location: [facility];  Service: Ophthalmology;  Laterality: Left;  Sleep apnea-CPAP  . CATARACT EXTRACTION W/PHACO Right 03/15/2019   Procedure: CATARACT EXTRACTION PHACO AND INTRAOCULAR LENS PLACEMENT (IOC) RIGHT PANOPTIX TORIC 1.34 00:17.1;  Surgeon: Galen Manila, MD;  Location: [facility];  Service: Ophthalmology;  Laterality: Right;  . NO PAST SURGERIES    . PTOSIS REPAIR Bilateral 02/04/2019  . SHOULDER ACROMIOPLASTY Right 05/27/2018   Procedure: SHOULDER ACROMIOPLASTY;  Surgeon: Bjorn Pippin, MD;  Location: [facility];  Service: Orthopedics;  Laterality: Right;  . SHOULDER ARTHROSCOPY WITH DISTAL CLAVICLE RESECTION Right 05/27/2018   Procedure: SHOULDER ARTHROSCOPY WITH DISTAL CLAVICLE RESECTION;  Surgeon: Bjorn Pippin, MD;  Location:  SURGERY CENTER;  Service: Orthopedics;  Laterality: Right;    Prior to Admission medications   Medication Sig Start Date End Date Taking? Authorizing Provider  clonazePAM (KLONOPIN) 0.5 MG tablet Take 0.5 mg by mouth at bedtime.    [provider]  finasteride (PROPECIA) 1 MG tablet Take 1 mg by mouth daily.    [provider]  PARoxetine (PAXIL) 40 MG tablet Take 20 mg by mouth at bedtime.     [provider]  rosuvastatin (CRESTOR) 10 MG tablet Take 10 mg by mouth daily. pm  [provider]  simvastatin (ZOCOR) 40 MG tablet Take 40 mg by mouth daily.    [provider]  testosterone cypionate (DEPOTESTOTERONE CYPIONATE) 100 MG/ML injection Inject into the muscle once a week. For IM use only    [provider]  valsartan-hydrochlorothiazide (DIOVAN-HCT) 160-12.5 MG per tablet Take 1 tablet by mouth daily. pm    [provider]    Allergies Neosporin [neomycin-bacitracin zn-polymyx] and Prednisone  No family history on file.  Social History Social History   Tobacco Use  .  Smoking status: Never Smoker  . Smokeless tobacco: Never Used  Vaping Use  . Vaping Use: Never used  Substance Use Topics  . Alcohol use: Yes    Comment: social rarely  . Drug use: Never    Review of Systems Level 5 caveat:  history/ROS limited by acute intoxication    constitutional: No fever/chills Eyes: No visual changes. ENT: No sore throat. Cardiovascular: Denies chest pain. Respiratory: Denies shortness of breath. Gastrointestinal: Positive for vomiting. Genitourinary: Negative for dysuria. Musculoskeletal: Negative for neck pain.  Negative for back pain. Integumentary: Negative for rash. Neurological: Negative for headaches, focal weakness or numbness.   ____________________________________________   PHYSICAL EXAM:  VITAL SIGNS: ED Triage Vitals  Enc Vitals Group     BP 06/03/20 0210 (!) 122/93     Pulse Rate 06/03/20 0210 81     Resp 06/03/20 0210 18     Temp 06/03/20 0254 97.7 F (36.5 C)     Temp Source 06/03/20 0254 Oral     SpO2 06/03/20 0210 100 %     Weight 06/03/20 0209 104.3 kg (230 lb)     Height 06/03/20 0209 1.753 m (5\' 9" )     Head Circumference --      Peak Flow --      Pain Score 06/03/20 0214 0     Pain Loc --      Pain Edu? --      Excl. in GC? --     Constitutional: Alert and oriented but consistent with intoxication. Eyes: Conjunctivae are normal.  Head: Atraumatic. Nose: No congestion/rhinnorhea. Mouth/Throat: Patient is wearing a mask. Neck: No stridor.  No meningeal signs.   Cardiovascular: Normal rate, regular rhythm. Good peripheral circulation. Respiratory: Normal respiratory effort.  No retractions. Gastrointestinal: Soft and nontender. No distention.  Musculoskeletal: Muscular body habitus.  No lower extremity tenderness nor edema. No gross deformities of extremities. Neurologic:  Normal speech and language. No gross focal neurologic deficits are appreciated.  Skin:  Skin is warm, dry and intact. Psychiatric: Mood and  affect are normal. Speech and behavior are normal.  ____________________________________________   LABS (all labs ordered are listed, but only abnormal results are displayed)  Labs Reviewed  COMPREHENSIVE METABOLIC PANEL - Abnormal; Notable for the following components:      Result Value   Potassium 3.2 (*)    CO2 21 (*)    Glucose, Bld 120 (*)    Creatinine, Ser 1.56 (*)    Calcium 8.6 (*)    AST 60 (*)    ALT 54 (*)    GFR, Estimated 53 (*)    All other components within normal limits  ETHANOL - Abnormal; Notable for the following components:   Alcohol, Ethyl (B) 181 (*)    All other components within normal limits  CBC - Abnormal; Notable for the following components:   MCH 25.7 (*)    All other components within normal limits  URINE DRUG SCREEN,  QUALITATIVE ([facility] ONLY)   ____________________________________________  EKG  No indication for emergent EKG ____________________________________________  RADIOLOGY I, Loleta Rose, personally viewed and evaluated these images (plain radiographs) as part of my medical decision making, as well as reviewing the written report by the radiologist.  ED MD interpretation: No indication for emergent imaging  Official radiology report(s): No results found.  ____________________________________________   PROCEDURES   Procedure(s) performed (including Critical Care):  Procedures   ____________________________________________   INITIAL IMPRESSION / MDM / ASSESSMENT AND PLAN / ED COURSE  As part of my medical decision making, I reviewed the following data within the electronic MEDICAL RECORD NUMBER Nursing notes reviewed and incorporated, Labs reviewed  and Notes from prior ED visits   Differential diagnosis includes, but is not limited to, intoxication with alcohol, drug side effect, injury such as head trauma.  Patient is well-appearing and in no distress at this time.  He said that he does drink too much and is otherwise  alert and oriented.  No indication for imaging.  Patient is on continuous pulse oximeter.  Ethanol level is 181, comprehensive metabolic panel shows an elevated creatinine but it is equal to his prior value from 2 years ago.  He has a very slight elevation of AST and ALT which is likely related to alcohol use.  He has no distress at this time and we will allow him to sleep and sober up at which point he can be discharged.  Given that he was vomiting and volume depleted I will give him a liter of fluid and 4 of Zofran.     Clinical Course as of 06/03/20 0745  Wynelle Link Jun 03, 2020  0742 The patient is awake, alert, and ate a full meal.  No more nausea or vomiting.  He is clinically sober and steady on his feet.  He is appropriate for discharge.  I gave my usual and customary return precautions. [CF]    Clinical Course User Index [CF] Loleta Rose, MD     ____________________________________________  FINAL CLINICAL IMPRESSION(S) / ED DIAGNOSES  Final diagnoses:  Acute alcoholic intoxication without complication (HCC)  Non-intractable vomiting with nausea, unspecified vomiting type     MEDICATIONS GIVEN DURING THIS VISIT:  Medications  ondansetron (ZOFRAN) injection 4 mg (4 mg Intravenous Given 06/03/20 0509)  lactated ringers bolus 1,000 mL (0 mLs Intravenous Stopped 06/03/20 0547)     ED Discharge Orders    None      *Please note:  Darryl Chaney was evaluated in Emergency Department on 06/03/2020 for the symptoms described in the history of present illness. He was evaluated in the context of the global COVID-19 pandemic, which necessitated consideration that the patient might be at risk for infection with the SARS-CoV-2 virus that causes COVID-19. Institutional protocols and algorithms that pertain to the evaluation of patients at risk for COVID-19 are in a state of rapid change based on information released by regulatory bodies including the CDC and federal and state organizations.  These policies and algorithms were followed during the patient's care in the ED.  Some ED evaluations and interventions may be delayed as a result of limited staffing during and after the pandemic.*  Note:  This document was prepared using Dragon voice recognition software and may include unintentional dictation errors.   Loleta Rose, MD 06/03/20 [phone]

## 2020-07-05 DIAGNOSIS — M76892 Other specified enthesopathies of left lower limb, excluding foot: Secondary | ICD-10-CM | POA: Diagnosis not present

## 2020-07-05 DIAGNOSIS — S76112A Strain of left quadriceps muscle, fascia and tendon, initial encounter: Secondary | ICD-10-CM | POA: Diagnosis not present

## 2020-07-05 DIAGNOSIS — M76891 Other specified enthesopathies of right lower limb, excluding foot: Secondary | ICD-10-CM | POA: Diagnosis not present

## 2020-08-12 ENCOUNTER — Encounter: Payer: Self-pay | Admitting: Emergency Medicine

## 2020-08-12 ENCOUNTER — Emergency Department
Admission: EM | Admit: 2020-08-12 | Discharge: 2020-08-13 | Disposition: A | Discharge status: Home / Self Care | Payer: BC Managed Care – PPO | Attending: Emergency Medicine | Admitting: Emergency Medicine

## 2020-08-12 ENCOUNTER — Other Ambulatory Visit: Payer: Self-pay

## 2020-08-12 ENCOUNTER — Emergency Department: Payer: BC Managed Care – PPO

## 2020-08-12 DIAGNOSIS — M542 Cervicalgia: Secondary | ICD-10-CM | POA: Diagnosis not present

## 2020-08-12 DIAGNOSIS — M5410 Radiculopathy, site unspecified: Secondary | ICD-10-CM

## 2020-08-12 DIAGNOSIS — S199XXA Unspecified injury of neck, initial encounter: Secondary | ICD-10-CM | POA: Diagnosis not present

## 2020-08-12 DIAGNOSIS — M5412 Radiculopathy, cervical region: Secondary | ICD-10-CM | POA: Diagnosis not present

## 2020-08-12 DIAGNOSIS — I6523 Occlusion and stenosis of bilateral carotid arteries: Secondary | ICD-10-CM | POA: Diagnosis not present

## 2020-08-12 DIAGNOSIS — Z79899 Other long term (current) drug therapy: Secondary | ICD-10-CM | POA: Diagnosis not present

## 2020-08-12 DIAGNOSIS — I10 Essential (primary) hypertension: Secondary | ICD-10-CM | POA: Diagnosis not present

## 2020-08-12 DIAGNOSIS — M4712 Other spondylosis with myelopathy, cervical region: Secondary | ICD-10-CM

## 2020-08-12 LAB — BASIC METABOLIC PANEL
Anion gap: 11 (ref 5–15)
BUN: 24 mg/dL — ABNORMAL HIGH (ref 6–20)
CO2: 24 mmol/L (ref 22–32)
Calcium: 9.5 mg/dL (ref 8.9–10.3)
Chloride: 105 mmol/L (ref 98–111)
Creatinine, Ser: 1.75 mg/dL — ABNORMAL HIGH (ref 0.61–1.24)
GFR, Estimated: 46 mL/min — ABNORMAL LOW (ref >60)
Glucose, Bld: 106 mg/dL — ABNORMAL HIGH (ref 70–99)
Potassium: 3.9 mmol/L (ref 3.5–5.1)
Sodium: 140 mmol/L (ref 135–145)

## 2020-08-12 MED ORDER — HYDROMORPHONE HCL 1 MG/ML IJ SOLN
1.0000 mg | Freq: Once | INTRAMUSCULAR | Status: AC
  Administered 2020-08-12: 1 mg via INTRAVENOUS
  Filled 2020-08-12: qty 1

## 2020-08-12 MED ORDER — IOHEXOL 350 MG/ML SOLN
75.0000 mL | Freq: Once | INTRAVENOUS | Status: AC | PRN
  Administered 2020-08-13: 75 mL via INTRAVENOUS

## 2020-08-12 MED ORDER — ONDANSETRON HCL 4 MG/2ML IJ SOLN
INTRAMUSCULAR | Status: AC
  Filled 2020-08-12: qty 2

## 2020-08-12 MED ORDER — ONDANSETRON HCL 4 MG/2ML IJ SOLN
4.0000 mg | Freq: Once | INTRAMUSCULAR | Status: AC
  Administered 2020-08-12: 4 mg via INTRAVENOUS

## 2020-08-12 NOTE — ED Triage Notes (Signed)
Pt reports that he was lifting weights on Tuesday and his neck began to hurt, he went to a chiropractor on Wednesday to get adjusted but it did not help, Thursday he went to a massage therapist and the pain has persisted and went down his right arm. He has taken, Flexeril, gabapentin, Celebrex, Oxycodone and Ibuprofen with no relief.

## 2020-08-12 NOTE — ED Provider Notes (Signed)
[facility] Emergency Department Provider Note  ____________________________________________   Event Date/Time   First MD Initiated Contact with Patient 08/12/20 2236     (approximate)  I have reviewed the triage vital signs and the nursing notes.   HISTORY  Chief Complaint Neck Pain   HPI Darryl Chaney is a 50 y.o. male with a past medical history of anxiety, HTN, HDL, depression, osteoarthritis, OSA with CPAP at night who presents for assessment of fairly severe worsening right-sided neck pain radiating down the back of his right arm..  Patient states this pain began after lifting weights at the gym on 5/24.  States that began gradually but seem to be getting progressively worse the last couple days.  He states he saw a chiropractor and massage therapist but neither helped and is not sure if that is what made it worse.  He has tried meloxicam, ibuprofen, gabapentin, Flexeril and oxycodone he got from a friend.  None of these medicines have helped.  He states he has a little tingling or decrease in station in his third and fourth digits in the right hand but no other numbness or weakness.  He denies any midline or left-sided neck pain.  Denies any back pain, abdominal pain, chest pain, cough, shortness of breath, fevers, headache, earache, sore throat or any other associated sick symptoms.  No prior similar episodes.  No other acute concerns at this time.         Past Medical History:  Diagnosis Date  . Anxiety   . Complication of anesthesia    see anesthesia note [facility]-02/04/19  . Depression   . High cholesterol   . Hypertension    controlled  . MRSA (methicillin resistant Staphylococcus aureus)    6 yrs ago, no problems since  . Osteoarthritis    right shoulder  . S/p dental crown    may be a little loose  . Sleep apnea    CPAP  . SOB (shortness of breath)     Patient Active Problem List   Diagnosis Date Noted  . HYPERTENSION 06/08/2008  .  BRONCHITIS 06/08/2008    Past Surgical History:  Procedure Laterality Date  . BICEPT TENODESIS Right 05/27/2018   Procedure: ,BICEP TENODESIS WITH EXTENSIVE DEBRIDEMENT;  Surgeon: Bjorn Pippin, MD;  Location: [facility];  Service: Orthopedics;  Laterality: Right;  . CATARACT EXTRACTION W/PHACO Left 02/22/2019   Procedure: CATARACT EXTRACTION PHACO AND INTRAOCULAR LENS PLACEMENT (IOC) LEFT VIVITY TORIC LENS 0.74,    00:12.3;  Surgeon: Galen Manila, MD;  Location: [facility];  Service: Ophthalmology;  Laterality: Left;  Sleep apnea-CPAP  . CATARACT EXTRACTION W/PHACO Right 03/15/2019   Procedure: CATARACT EXTRACTION PHACO AND INTRAOCULAR LENS PLACEMENT (IOC) RIGHT PANOPTIX TORIC 1.34 00:17.1;  Surgeon: Galen Manila, MD;  Location: [facility];  Service: Ophthalmology;  Laterality: Right;  . NO PAST SURGERIES    . PTOSIS REPAIR Bilateral 02/04/2019  . SHOULDER ACROMIOPLASTY Right 05/27/2018   Procedure: SHOULDER ACROMIOPLASTY;  Surgeon: Bjorn Pippin, MD;  Location: [facility];  Service: Orthopedics;  Laterality: Right;  . SHOULDER ARTHROSCOPY WITH DISTAL CLAVICLE RESECTION Right 05/27/2018   Procedure: SHOULDER ARTHROSCOPY WITH DISTAL CLAVICLE RESECTION;  Surgeon: Bjorn Pippin, MD;  Location: [facility];  Service: Orthopedics;  Laterality: Right;    Prior to Admission medications   Medication Sig Start Date End Date Taking? Authorizing Provider  clonazePAM (KLONOPIN) 0.5 MG tablet Take 0.5 mg by mouth  at bedtime.    [provider]  finasteride (PROPECIA) 1 MG tablet Take 1 mg by mouth daily.    [provider]  PARoxetine (PAXIL) 40 MG tablet Take 20 mg by mouth at bedtime.     [provider]  rosuvastatin (CRESTOR) 10 MG tablet Take 10 mg by mouth daily. pm    [provider]  simvastatin (ZOCOR) 40 MG tablet Take 40 mg by mouth daily.    [provider]  testosterone  cypionate (DEPOTESTOTERONE CYPIONATE) 100 MG/ML injection Inject into the muscle once a week. For IM use only    [provider]  valsartan-hydrochlorothiazide (DIOVAN-HCT) 160-12.5 MG per tablet Take 1 tablet by mouth daily. pm    [provider]    Allergies Neosporin [neomycin-bacitracin zn-polymyx] and Prednisone  No family history on file.  Social History Social History   Tobacco Use  . Smoking status: Never Smoker  . Smokeless tobacco: Never Used  Vaping Use  . Vaping Use: Never used  Substance Use Topics  . Alcohol use: Yes    Comment: social rarely  . Drug use: Never    Review of Systems  Review of Systems  Constitutional: Negative for chills and fever.  HENT: Negative for sore throat.   Eyes: Negative for pain.  Respiratory: Negative for cough and stridor.   Cardiovascular: Negative for chest pain.  Gastrointestinal: Negative for vomiting.  Genitourinary: Negative for dysuria.  Musculoskeletal: Positive for neck pain.  Skin: Negative for rash.  Neurological: Positive for sensory change ( 3rd and 4th digits on L ). Negative for seizures, loss of consciousness and headaches.  Psychiatric/Behavioral: Negative for suicidal ideas.  All other systems reviewed and are negative.     ____________________________________________   PHYSICAL EXAM:  VITAL SIGNS: ED Triage Vitals  Enc Vitals Group     BP 08/12/20 2200 (!) 164/98     Pulse Rate 08/12/20 2157 96     Resp 08/12/20 2157 16     Temp 08/12/20 2157 98.3 F (36.8 C)     Temp Source 08/12/20 2157 Oral     SpO2 08/12/20 2157 98 %     Weight 08/12/20 2159 231 lb 4.2 oz (104.9 kg)     Height 08/12/20 2159 5\' 9"  (1.753 m)     Head Circumference --      Peak Flow --      Pain Score 08/12/20 2159 10     Pain Loc --      Pain Edu? --      Excl. in GC? --    Vitals:   08/12/20 2157 08/12/20 2200  BP:  (!) 164/98  Pulse: 96   Resp: 16   Temp: 98.3 F (36.8 C)   SpO2: 98%     Physical Exam Vitals and nursing note reviewed.  Constitutional:      Appearance: He is well-developed.  HENT:     Head: Normocephalic and atraumatic.     Right Ear: External ear normal.     Left Ear: External ear normal.     Nose: Nose normal.  Eyes:     Conjunctiva/sclera: Conjunctivae normal.  Cardiovascular:     Rate and Rhythm: Normal rate and regular rhythm.     Heart sounds: No murmur heard.   Pulmonary:     Effort: Pulmonary effort is normal. No respiratory distress.  Abdominal:     Palpations: Abdomen is soft.     Tenderness: There is no abdominal tenderness.  Musculoskeletal:  Cervical back: Neck supple.  Skin:    General: Skin is warm and dry.     Capillary Refill: Capillary refill takes less than 2 seconds.  Neurological:     Mental Status: He is alert and oriented to person, place, and time.  Psychiatric:        Mood and Affect: Mood normal.     Cranial nerves II through XII grossly intact.  Patient has symmetric strength in his bilateral upper extremities.  Sensation is intact in the distribution of the radial ulnar and median nerves although patient states it is a little bit less light touch on the dorsum of the second and third digits.  Hands are otherwise unremarkable and he has a 2+ bilateral radial pulses.  His neck is quite stiff and has difficulty turning to the right and flexing to the right although no overlying skin changes.  Trapezius is quite tense.  No midline tenderness or overlying skin changes or other changes about the shoulder. ____________________________________________   LABS (all labs ordered are listed, but only abnormal results are displayed)  Labs Reviewed  BASIC METABOLIC PANEL   ____________________________________________  EKG  ____________________________________________  RADIOLOGY  ED MD interpretation:   Official radiology report(s): No results  found.  ____________________________________________   PROCEDURES  Procedure(s) performed (including Critical Care):  Procedures   ____________________________________________   INITIAL IMPRESSION / ASSESSMENT AND PLAN / ED COURSE      Patient presents with above-stated history and exam for assessment of neck pain began gradually after lifting weights couple days ago.  It is since gotten worse after he saw a massage therapist and a chiropractor and has not responded to multiple medications and above.  He does note slightly decrease in station over the dorsum of his third and fourth digits on the right hand he is otherwise completely neurologically intact and has no objective numbness but states it feels little different to light touch.  Trapezius is quite tight and he has some difficulty turning his head to the right or flexing to the right otherwise unremarkable exam of the head neck and back.  Primary differential includes cervical artery dissection, acute cervical goal radiculopathy from possible herniated cervical disc, muscle spasm versus other cause of cervical radiculitis.  Low suspicion for acute infectious process given absence of fever or other constitutional symptoms with no overlying skin changes or other findings on exam to suggest deep space infection in the head, neck, shoulder.  Will obtain a BMP to assess kidney function and if this is nondepressed will obtain a CTA neck to rule out cervical artery dissection.  In the meantime we will give analgesia and if kidney function is appropriate we will give a dose of Toradol.  Care patient signed over to oncoming rider approximately 2300.  Plan is to follow-up BMP CTA and reassess.  If CTA is negative and pain is better controlled patient likely will safely go home with outpatient follow-up.       ____________________________________________   FINAL CLINICAL IMPRESSION(S) / ED DIAGNOSES  Final diagnoses:  Neck pain   Radiculopathy, unspecified spinal region    Medications  HYDROmorphone (DILAUDID) injection 1 mg (has no administration in time range)     ED Discharge Orders    None       Note:  This document was prepared using Dragon voice recognition software and may include unintentional dictation errors.   Gilles Chiquito, MD 08/12/20 [phone]

## 2020-08-13 ENCOUNTER — Emergency Department: Payer: BC Managed Care – PPO

## 2020-08-13 DIAGNOSIS — I6523 Occlusion and stenosis of bilateral carotid arteries: Secondary | ICD-10-CM | POA: Diagnosis not present

## 2020-08-13 DIAGNOSIS — M542 Cervicalgia: Secondary | ICD-10-CM | POA: Diagnosis not present

## 2020-08-13 DIAGNOSIS — S199XXA Unspecified injury of neck, initial encounter: Secondary | ICD-10-CM | POA: Diagnosis not present

## 2020-08-13 MED ORDER — OXYCODONE HCL 5 MG PO TABS
5.0000 mg | ORAL_TABLET | Freq: Once | ORAL | Status: AC
  Administered 2020-08-13: 5 mg via ORAL
  Filled 2020-08-13: qty 1

## 2020-08-13 MED ORDER — KETOROLAC TROMETHAMINE 30 MG/ML IJ SOLN
15.0000 mg | Freq: Once | INTRAMUSCULAR | Status: AC
  Administered 2020-08-13: 15 mg via INTRAVENOUS
  Filled 2020-08-13: qty 1

## 2020-08-13 MED ORDER — ACETAMINOPHEN 500 MG PO TABS
1000.0000 mg | ORAL_TABLET | Freq: Once | ORAL | Status: AC
  Administered 2020-08-13: 1000 mg via ORAL
  Filled 2020-08-13: qty 2

## 2020-08-13 MED ORDER — OXYCODONE-ACETAMINOPHEN 5-325 MG PO TABS: 1.0000 | ORAL_TABLET | ORAL | 0 refills | Status: AC | PRN

## 2020-08-13 MED ORDER — CYCLOBENZAPRINE HCL 10 MG PO TABS
10.0000 mg | ORAL_TABLET | Freq: Once | ORAL | Status: AC
  Administered 2020-08-13: 10 mg via ORAL
  Filled 2020-08-13: qty 1

## 2020-08-13 MED ORDER — GABAPENTIN 300 MG PO CAPS: 300.0000 mg | ORAL_CAPSULE | Freq: Three times a day (TID) | ORAL | 0 refills | Status: AC

## 2020-08-13 NOTE — Discharge Instructions (Signed)
Follow-up with neurosurgery as soon as possible for further management.  Follow-up with your primary care doctor to help manage your pain.  Return to the emergency room for weakness or numbness of your arms, worsening pain.

## 2020-08-13 NOTE — ED Provider Notes (Signed)
Accepted care of this patient at 11PM pending results of CT angio.  CT angio was unremarkable.  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  Patient also has mild central spinal canal stenosis.  Remains completely neurologically intact otherwise.  Will refer to neurosurgery for further management.  Will discharge on gabapentin and Percocet for pain.  Recommended following up with his PCP first thing Tuesday morning for ongoing pain management.  Discussed my standard return precautions   I have personally reviewed the images performed during this visit and I agree with the Radiologist's read.   Interpretation by Radiologist:  CT Angio Neck W and/or Wo Contrast  Result Date: 08/13/2020 CLINICAL DATA:  Initial evaluation for acute neck pain status post recent trauma. EXAM: CT ANGIOGRAPHY NECK TECHNIQUE: Multidetector CT imaging of the neck was performed using the standard protocol during bolus administration of intravenous contrast. Multiplanar CT image reconstructions and MIPs were obtained to evaluate the vascular anatomy. Carotid stenosis measurements (when applicable) are obtained utilizing NASCET criteria, using the distal internal carotid diameter as the denominator. CONTRAST:  71mL OMNIPAQUE IOHEXOL 350 MG/ML SOLN COMPARISON:  None. FINDINGS: Aortic arch: Visualized aortic arch of normal caliber with normal branch pattern. No hemodynamically significant stenosis about the origin of the great vessels. Right carotid system: Right CCA patent from its origin to the bifurcation without stenosis. Mild atheromatous irregularity about the right carotid bulb/proximal right ICA with associated mild narrowing of up to 35% by NASCET criteria. Right ICA patent distally without stenosis, dissection or occlusion. Left carotid system: Left CCA patent from its  origin to the bifurcation without stenosis. Mild atheromatous irregularity about the left carotid bulb/proximal left ICA with no more than mild 25% stenosis by NASCET criteria. Left ICA patent distally without stenosis, dissection occlusion. Vertebral arteries: Both vertebral arteries arise from subclavian arteries. No proximal subclavian artery stenosis. Vertebral arteries patent without stenosis, dissection or occlusion. Skeleton: No visible acute osseous finding. No discrete or worrisome osseous lesions. Other neck: No other acute soft tissue abnormality within the neck. No mass or adenopathy. Upper chest: Visualized upper chest demonstrates no acute finding. IMPRESSION: 1. Negative CTA of the neck. No evidence for acute traumatic vascular injury identified. 2. Mild atheromatous irregularity about the carotid bifurcations/proximal ICAs bilaterally with associated mild 25-35% stenosis, right slightly worse than left. 3. Wide patency of both vertebral arteries within the neck. Electronically Signed   By: Rise Mu M.D.   On: 08/13/2020 00:46   MR Cervical Spine Wo Contrast  Result Date: 08/13/2020 CLINICAL DATA:  Initial evaluation for worsened right-sided neck pain with radiation into right upper extremity. Recent injury lifting weights. EXAM: MRI CERVICAL SPINE WITHOUT CONTRAST TECHNIQUE: Multiplanar, multisequence MR imaging of the cervical spine was performed. No intravenous contrast was administered. COMPARISON:  None. FINDINGS: Alignment: Straightening of the normal cervical lordosis. No listhesis. Vertebrae: Vertebral body height maintained without acute or chronic fracture. Bone marrow signal intensity within normal limits. No discrete or worrisome osseous lesions. No abnormal marrow edema. Cord: Normal signal and morphology. Posterior Fossa, vertebral arteries, paraspinal tissues: Visualized brain and posterior fossa within normal limits. Craniocervical junction normal. Retention cyst noted  at the nasopharynx. Paraspinous and prevertebral soft tissues normal. Normal flow voids seen within the vertebral arteries bilaterally. Disc levels: C2-C3: Unremarkable. C3-C4: Right subarticular to foraminal disc osteophyte complex (series 9, image 9). Superimposed mild  uncovertebral spurring. Resultant mild spinal stenosis with moderate to severe right C4 foraminal stenosis. Finding could contribute to right-sided radicular symptoms. C4-C5: Broad-based posterior disc osteophyte flattens and partially faces the ventral thecal sac with resultant mild spinal stenosis. Left worse than right uncovertebral spurring with minimal facet hypertrophy. Moderate left with mild right C5 foraminal stenosis. C5-C6: Broad-based right paracentral disc osteophyte flattens the right ventral thecal sac (series 8, image 18). Mild spinal stenosis with flattening of the right ventral cord. Moderate right worse than left C6 foraminal narrowing. Finding could contribute to right-sided symptoms. C6-C7: Shallow left paracentral disc protrusion indents the left ventral thecal sac (series 8, image 22). Mild spinal stenosis without significant cord deformity. Mild left C7 foraminal narrowing. Right neural foramen remains patent. C7-T1: Minimal uncovertebral hypertrophy without significant disc bulge. Mild facet hypertrophy. No significant spinal stenosis. Foramina appear grossly patent. Visualized upper thoracic spine demonstrates no significant finding. IMPRESSION: 1. No evidence for acute traumatic injury. 2. Right subarticular to foraminal disc osteophyte complex at C3-4 with resultant mild spinal and moderate to severe right C4 foraminal narrowing. Finding could contribute to right-sided symptoms. 3. Right paracentral disc osteophyte at C5-6 with resultant mild canal and moderate right worse than left C6 foraminal stenosis. Finding could also contribute to right-sided radicular symptoms. 4. Additional spondylosis at C4-5 and C6-7 with  resultant mild spinal stenosis. Electronically Signed   By: Rise Mu M.D.   On: 08/13/2020 02:27      Nita Sickle, MD 08/13/20 [phone]

## 2020-08-16 DIAGNOSIS — M5412 Radiculopathy, cervical region: Secondary | ICD-10-CM | POA: Diagnosis not present

## 2020-08-20 DIAGNOSIS — M542 Cervicalgia: Secondary | ICD-10-CM | POA: Diagnosis not present

## 2020-08-20 DIAGNOSIS — M519 Unspecified thoracic, thoracolumbar and lumbosacral intervertebral disc disorder: Secondary | ICD-10-CM | POA: Diagnosis not present

## 2020-08-28 DIAGNOSIS — M5412 Radiculopathy, cervical region: Secondary | ICD-10-CM | POA: Diagnosis not present

## 2020-09-11 DIAGNOSIS — M519 Unspecified thoracic, thoracolumbar and lumbosacral intervertebral disc disorder: Secondary | ICD-10-CM | POA: Diagnosis not present

## 2020-09-11 DIAGNOSIS — M5412 Radiculopathy, cervical region: Secondary | ICD-10-CM | POA: Diagnosis not present

## 2020-09-13 DIAGNOSIS — M5412 Radiculopathy, cervical region: Secondary | ICD-10-CM | POA: Diagnosis not present

## 2020-10-05 DIAGNOSIS — M5412 Radiculopathy, cervical region: Secondary | ICD-10-CM | POA: Diagnosis not present

## 2020-10-22 DIAGNOSIS — G4700 Insomnia, unspecified: Secondary | ICD-10-CM | POA: Diagnosis not present

## 2020-10-22 DIAGNOSIS — R197 Diarrhea, unspecified: Secondary | ICD-10-CM | POA: Diagnosis not present

## 2020-10-25 DIAGNOSIS — J019 Acute sinusitis, unspecified: Secondary | ICD-10-CM | POA: Diagnosis not present

## 2020-10-25 DIAGNOSIS — K529 Noninfective gastroenteritis and colitis, unspecified: Secondary | ICD-10-CM | POA: Diagnosis not present

## 2020-11-13 DIAGNOSIS — R1084 Generalized abdominal pain: Secondary | ICD-10-CM | POA: Diagnosis not present

## 2020-11-13 DIAGNOSIS — R109 Unspecified abdominal pain: Secondary | ICD-10-CM | POA: Diagnosis not present

## 2020-11-13 DIAGNOSIS — R195 Other fecal abnormalities: Secondary | ICD-10-CM | POA: Diagnosis not present

## 2020-12-14 DIAGNOSIS — M76892 Other specified enthesopathies of left lower limb, excluding foot: Secondary | ICD-10-CM | POA: Diagnosis not present

## 2020-12-14 DIAGNOSIS — M76891 Other specified enthesopathies of right lower limb, excluding foot: Secondary | ICD-10-CM | POA: Diagnosis not present

## 2020-12-14 DIAGNOSIS — M7042 Prepatellar bursitis, left knee: Secondary | ICD-10-CM | POA: Diagnosis not present

## 2020-12-14 DIAGNOSIS — M7041 Prepatellar bursitis, right knee: Secondary | ICD-10-CM | POA: Diagnosis not present

## 2020-12-27 DIAGNOSIS — R109 Unspecified abdominal pain: Secondary | ICD-10-CM | POA: Diagnosis not present

## 2020-12-27 DIAGNOSIS — E291 Testicular hypofunction: Secondary | ICD-10-CM | POA: Diagnosis not present

## 2020-12-27 DIAGNOSIS — R3915 Urgency of urination: Secondary | ICD-10-CM | POA: Diagnosis not present

## 2021-01-08 DIAGNOSIS — M25561 Pain in right knee: Secondary | ICD-10-CM | POA: Diagnosis not present

## 2021-01-08 DIAGNOSIS — M25562 Pain in left knee: Secondary | ICD-10-CM | POA: Diagnosis not present

## 2021-01-08 DIAGNOSIS — M6281 Muscle weakness (generalized): Secondary | ICD-10-CM | POA: Diagnosis not present

## 2021-01-21 DIAGNOSIS — M25562 Pain in left knee: Secondary | ICD-10-CM | POA: Diagnosis not present

## 2021-01-21 DIAGNOSIS — M25561 Pain in right knee: Secondary | ICD-10-CM | POA: Diagnosis not present

## 2021-01-21 DIAGNOSIS — M6281 Muscle weakness (generalized): Secondary | ICD-10-CM | POA: Diagnosis not present

## 2021-02-01 DIAGNOSIS — E230 Hypopituitarism: Secondary | ICD-10-CM | POA: Diagnosis not present

## 2021-03-25 DIAGNOSIS — E291 Testicular hypofunction: Secondary | ICD-10-CM | POA: Diagnosis not present

## 2021-03-25 DIAGNOSIS — Z13220 Encounter for screening for lipoid disorders: Secondary | ICD-10-CM | POA: Diagnosis not present

## 2021-03-25 DIAGNOSIS — E230 Hypopituitarism: Secondary | ICD-10-CM | POA: Diagnosis not present

## 2021-03-25 DIAGNOSIS — D582 Other hemoglobinopathies: Secondary | ICD-10-CM | POA: Diagnosis not present

## 2021-04-03 ENCOUNTER — Other Ambulatory Visit: Payer: Self-pay | Admitting: Internal Medicine

## 2021-04-03 DIAGNOSIS — E7849 Other hyperlipidemia: Secondary | ICD-10-CM

## 2021-04-09 ENCOUNTER — Telehealth (HOSPITAL_COMMUNITY): Payer: Self-pay | Admitting: Emergency Medicine

## 2021-04-09 NOTE — Telephone Encounter (Signed)
Reaching out to patient to offer assistance regarding upcoming cardiac imaging study; pt verbalizes understanding of appt date/time, parking situation and where to check in, pre-test NPO status and medications ordered, and verified current allergies; name and call back number provided for further questions should they arise Marchia Bond RN Navigator Cardiac Imaging [facility] [phone] office [phone] cell  Holding valsartan-HCTZ, tildalafil Taking 50mg  metoprolol tartrate Denies iv issues Arrival 230

## 2021-04-10 ENCOUNTER — Telehealth (HOSPITAL_COMMUNITY): Payer: Self-pay | Admitting: Emergency Medicine

## 2021-04-10 NOTE — Telephone Encounter (Signed)
Calling patient to explain that the test hes having done tomorrow is a Calcium Score CT, not a contrasted coronary CTA.   I explained he is no longer required to take metoprolol tartrate prior to scan  I also explained that he will need to bring $99 with him to the appt as this test is out of pocket and not submitted to ins.   Rockwell Alexandria RN Navigator Cardiac Imaging [facility] [phone] Office  [phone] Cell

## 2021-04-11 ENCOUNTER — Ambulatory Visit
Admission: RE | Admit: 2021-04-11 | Discharge: 2021-04-11 | Disposition: A | Discharge status: Home / Self Care | Payer: BC Managed Care – PPO | Source: Ambulatory Visit | Attending: Internal Medicine | Admitting: Internal Medicine

## 2021-04-11 ENCOUNTER — Other Ambulatory Visit: Payer: Self-pay

## 2021-04-11 DIAGNOSIS — E7849 Other hyperlipidemia: Secondary | ICD-10-CM | POA: Insufficient documentation

## 2021-04-12 DIAGNOSIS — M65252 Calcific tendinitis, left thigh: Secondary | ICD-10-CM | POA: Diagnosis not present

## 2021-04-12 DIAGNOSIS — M76891 Other specified enthesopathies of right lower limb, excluding foot: Secondary | ICD-10-CM | POA: Diagnosis not present

## 2021-04-12 DIAGNOSIS — M25561 Pain in right knee: Secondary | ICD-10-CM | POA: Diagnosis not present

## 2021-04-12 DIAGNOSIS — M25562 Pain in left knee: Secondary | ICD-10-CM | POA: Diagnosis not present

## 2021-04-12 DIAGNOSIS — G8929 Other chronic pain: Secondary | ICD-10-CM | POA: Diagnosis not present

## 2021-04-12 DIAGNOSIS — M65251 Calcific tendinitis, right thigh: Secondary | ICD-10-CM | POA: Diagnosis not present

## 2021-04-18 DIAGNOSIS — E291 Testicular hypofunction: Secondary | ICD-10-CM | POA: Diagnosis not present

## 2021-04-18 DIAGNOSIS — E7849 Other hyperlipidemia: Secondary | ICD-10-CM | POA: Diagnosis not present

## 2021-04-18 DIAGNOSIS — M2550 Pain in unspecified joint: Secondary | ICD-10-CM | POA: Diagnosis not present

## 2021-04-18 DIAGNOSIS — E230 Hypopituitarism: Secondary | ICD-10-CM | POA: Diagnosis not present

## 2021-04-25 DIAGNOSIS — M25562 Pain in left knee: Secondary | ICD-10-CM | POA: Diagnosis not present

## 2021-04-25 DIAGNOSIS — M25561 Pain in right knee: Secondary | ICD-10-CM | POA: Diagnosis not present

## 2021-04-30 DIAGNOSIS — M76891 Other specified enthesopathies of right lower limb, excluding foot: Secondary | ICD-10-CM | POA: Diagnosis not present

## 2021-04-30 DIAGNOSIS — S8001XA Contusion of right knee, initial encounter: Secondary | ICD-10-CM | POA: Diagnosis not present

## 2021-04-30 DIAGNOSIS — S8002XA Contusion of left knee, initial encounter: Secondary | ICD-10-CM | POA: Diagnosis not present

## 2021-04-30 DIAGNOSIS — M76892 Other specified enthesopathies of left lower limb, excluding foot: Secondary | ICD-10-CM | POA: Diagnosis not present

## 2021-05-09 DIAGNOSIS — M76892 Other specified enthesopathies of left lower limb, excluding foot: Secondary | ICD-10-CM | POA: Diagnosis not present

## 2021-05-09 DIAGNOSIS — M76891 Other specified enthesopathies of right lower limb, excluding foot: Secondary | ICD-10-CM | POA: Diagnosis not present

## 2021-05-27 DIAGNOSIS — L918 Other hypertrophic disorders of the skin: Secondary | ICD-10-CM | POA: Diagnosis not present

## 2021-05-27 DIAGNOSIS — Z789 Other specified health status: Secondary | ICD-10-CM | POA: Diagnosis not present

## 2021-05-27 DIAGNOSIS — L298 Other pruritus: Secondary | ICD-10-CM | POA: Diagnosis not present

## 2021-05-27 DIAGNOSIS — L538 Other specified erythematous conditions: Secondary | ICD-10-CM | POA: Diagnosis not present

## 2021-06-04 DIAGNOSIS — M76892 Other specified enthesopathies of left lower limb, excluding foot: Secondary | ICD-10-CM | POA: Diagnosis not present

## 2021-06-04 DIAGNOSIS — M76891 Other specified enthesopathies of right lower limb, excluding foot: Secondary | ICD-10-CM | POA: Diagnosis not present

## 2021-06-14 DIAGNOSIS — E7849 Other hyperlipidemia: Secondary | ICD-10-CM | POA: Diagnosis not present

## 2021-07-26 DIAGNOSIS — M76891 Other specified enthesopathies of right lower limb, excluding foot: Secondary | ICD-10-CM | POA: Diagnosis not present

## 2021-07-26 DIAGNOSIS — M76892 Other specified enthesopathies of left lower limb, excluding foot: Secondary | ICD-10-CM | POA: Diagnosis not present

## 2021-07-26 DIAGNOSIS — M6580 Other synovitis and tenosynovitis, unspecified site: Secondary | ICD-10-CM | POA: Diagnosis not present

## 2022-03-11 IMAGING — CT CT ANGIO NECK
3 of 7 series · 9 of 34 positions shown · IV contrast (APPLIED)
Comparison: None.

CLINICAL DATA: Initial evaluation for acute neck pain status post
recent trauma.

EXAM:
CT ANGIOGRAPHY NECK
TECHNIQUE: Multidetector CT imaging of the neck was performed using the
standard protocol during bolus administration of intravenous
contrast. Multiplanar CT image reconstructions and MIPs were
obtained to evaluate the vascular anatomy. Carotid stenosis
measurements (when applicable) are obtained utilizing NASCET
criteria, using the distal internal carotid diameter as the
denominator.
CONTRAST:  75mL OMNIPAQUE IOHEXOL 350 MG/ML SOLN

[Series 5: cta neck · axial · 0.53mm/px · z∈[-268,-178]mm · 2 of 135 slices shown]
[im 45/135  soft-tissue]
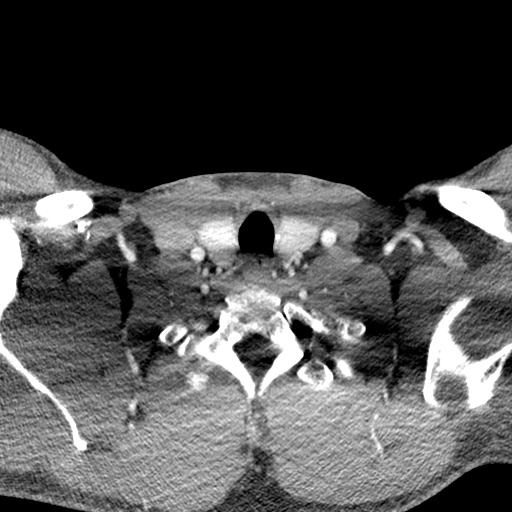
[im 90/135  soft-tissue]
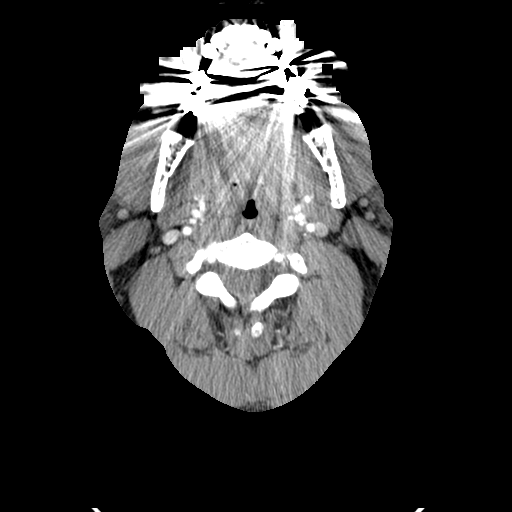

[Series 7: ax thin · axial · 0.61mm/px · z∈[-332,-137]mm · 6 of 273 slices shown]
[im 39/273  soft-tissue]
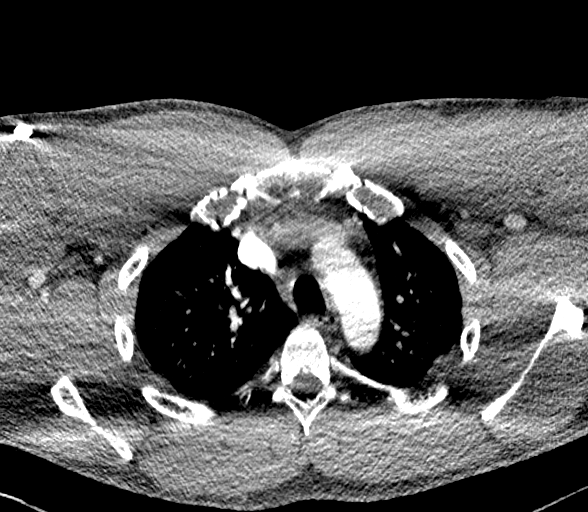
[im 78/273  bone]
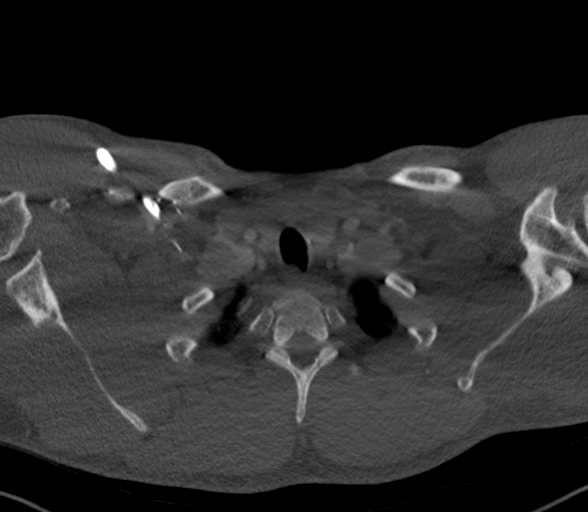
[im 117/273  soft-tissue]
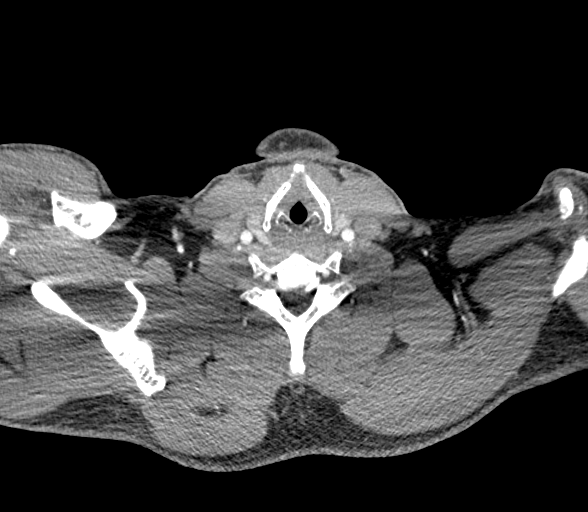
[im 156/273  bone]
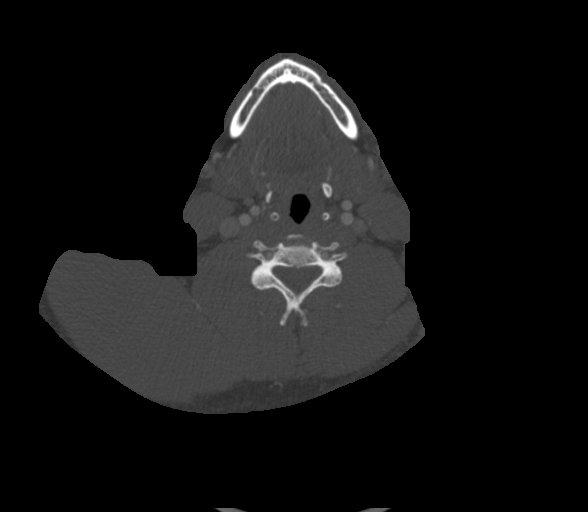
[im 195/273  soft-tissue]
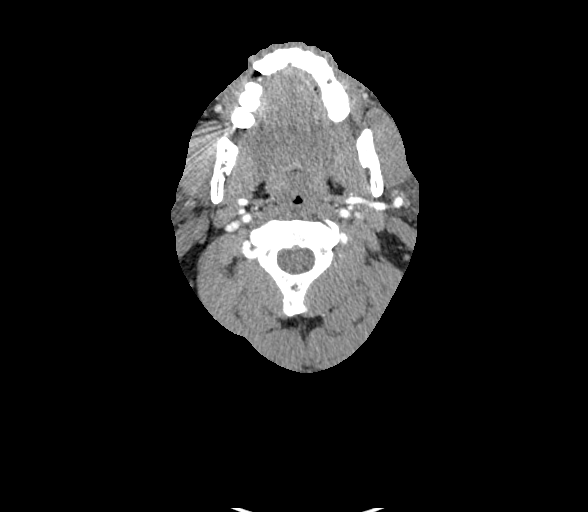
[im 234/273  bone]
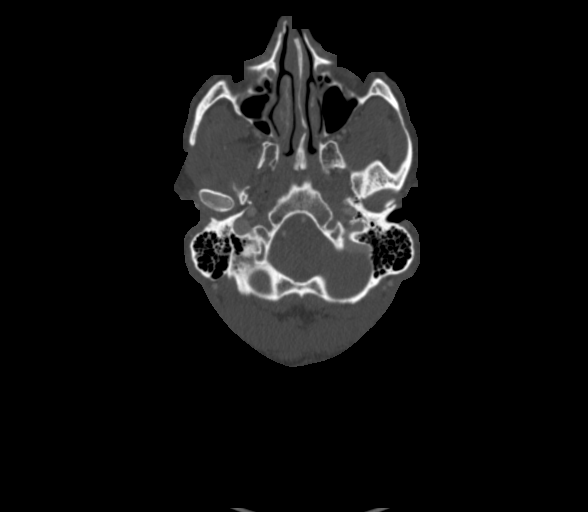

[Series 9: sagittal thin · sagittal · 0.54mm/px · 1 of 298 slices shown]
[im 52/298  soft-tissue]
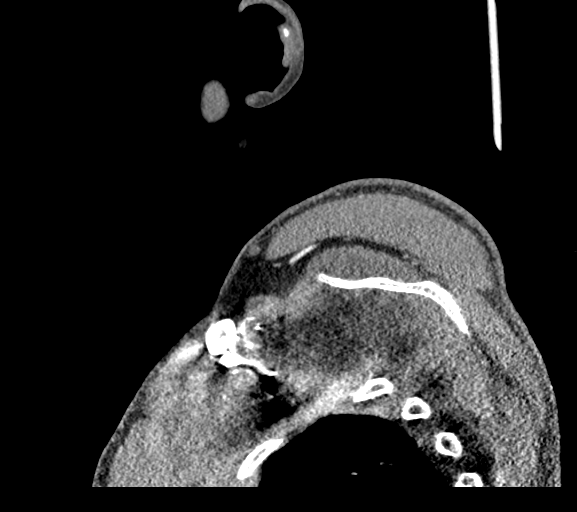

[9 of 34 positions shown; findings below may reference images not displayed]

FINDINGS: Aortic arch: Visualized aortic arch of normal caliber with normal
branch pattern. No hemodynamically significant stenosis about the
origin of the great vessels.

Right carotid system: Right CCA patent from its origin to the
bifurcation without stenosis. Mild atheromatous irregularity about
the right carotid bulb/proximal right ICA with associated mild
narrowing of up to 35% by NASCET criteria. Right ICA patent distally
without stenosis, dissection or occlusion.

Left carotid system: Left CCA patent from its origin to the
bifurcation without stenosis. Mild atheromatous irregularity about
the left carotid bulb/proximal left ICA with no more than mild 25%
stenosis by NASCET criteria. Left ICA patent distally without
stenosis, dissection occlusion.

Vertebral arteries: Both vertebral arteries arise from subclavian
arteries. No proximal subclavian artery stenosis. Vertebral arteries
patent without stenosis, dissection or occlusion.

Skeleton: No visible acute osseous finding. No discrete or worrisome
osseous lesions.

Other neck: No other acute soft tissue abnormality within the neck.
No mass or adenopathy.

Upper chest: Visualized upper chest demonstrates no acute finding.
IMPRESSION: 1. Negative CTA of the neck. No evidence for acute traumatic
vascular injury identified.
2. Mild atheromatous irregularity about the carotid
bifurcations/proximal ICAs bilaterally with associated mild 25-35%
stenosis, right slightly worse than left.
3. Wide patency of both vertebral arteries within the neck.

## 2022-11-07 IMAGING — CT CT CARDIAC CORONARY ARTERY CALCIUM SCORE
3 series · 14 of 20 positions shown, 16 images · non-contrast
Comparison: Chest radiographs 11/13/2015.
COMPARISON: Chest radiographs 11/13/2015.

Addendum:
EXAM:
OVER-READ INTERPRETATION  CT CHEST

The following report is an over-read performed by radiologist Dr.
Sukamto Eyang [[email or web address]] on 04/11/2021. This over-read
does not include interpretation of cardiac or coronary anatomy or
pathology. The calcium score interpretation by the cardiologist is
attached.
CLINICAL DATA: Risk stratification
Coronary Calcium Score
TECHNIQUE: The patient was scanned on a Siemens Somatom go.Top Scanner. Axial
non-contrast 3 mm slices were carried out through the heart. The
data set was analyzed on a dedicated work station and scored using
the Agatson method.

[Series 2: sa36 calcium scoring 3.00 · axial · 0.38mm/px · z∈[-1110,-1029]mm · 4 of 46 slices shown]
[im 10/46  vessel]
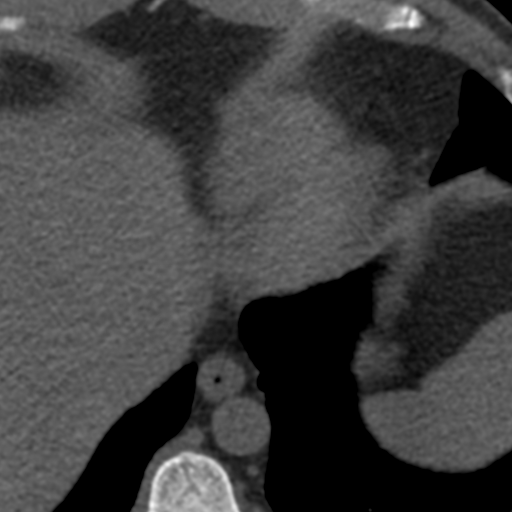
[im 19/46  vessel]
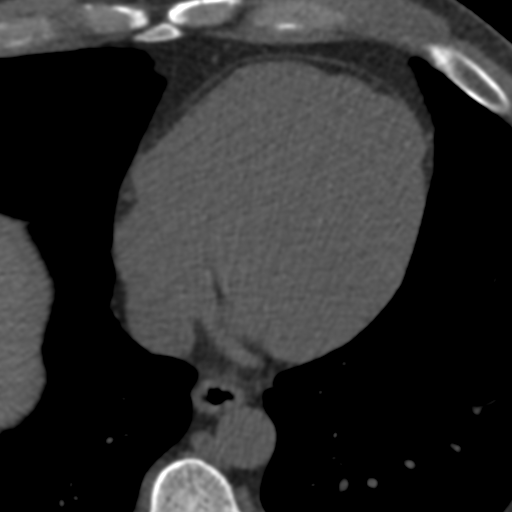
[im 28/46  vessel]
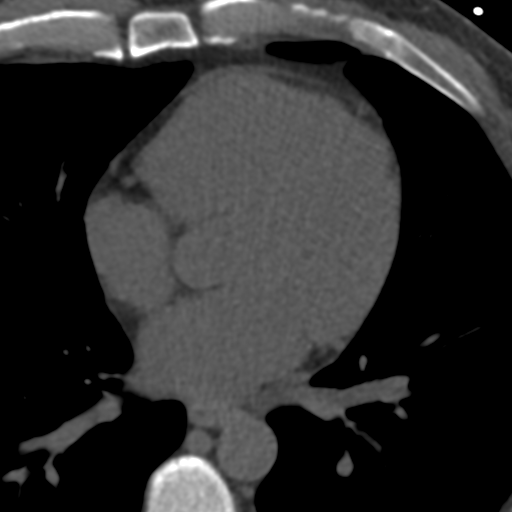
[im 37/46  vessel]
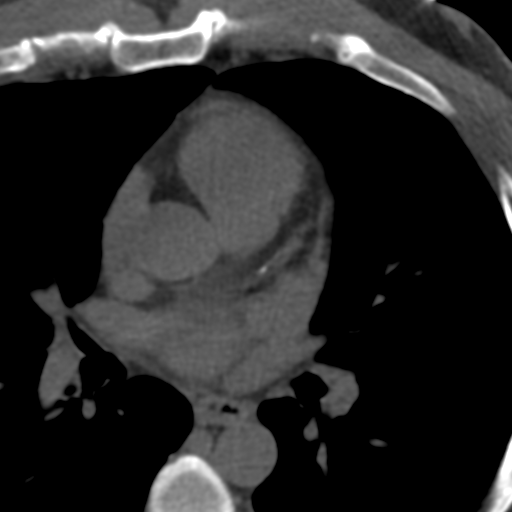

[Series 5: full fov st calcium scoring 3.00 · axial · 0.70mm/px · z∈[-1116,-1026]mm · 5 of 46 slices shown, 7 images]
[im 8/46  vessel]
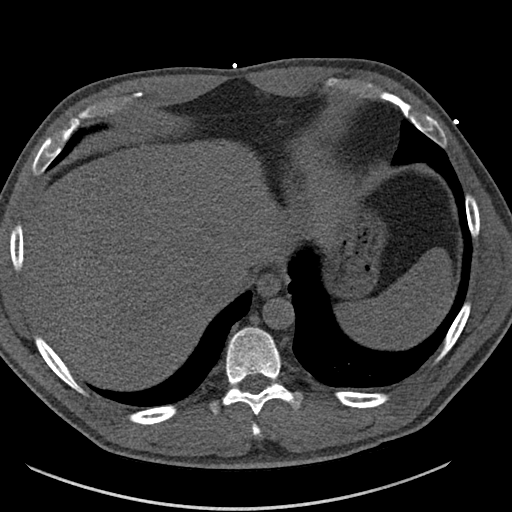
[im 8/46  lung]
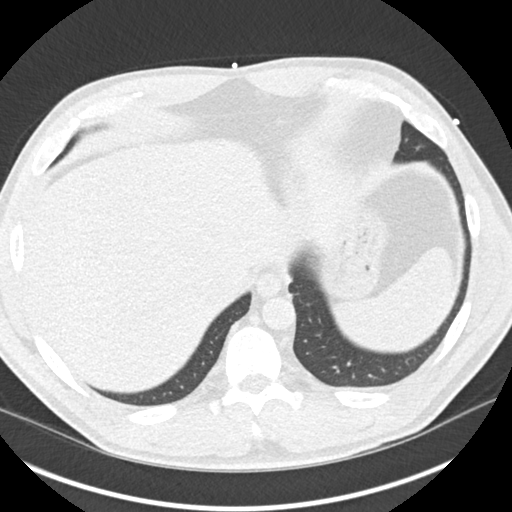
[im 16/46  vessel]
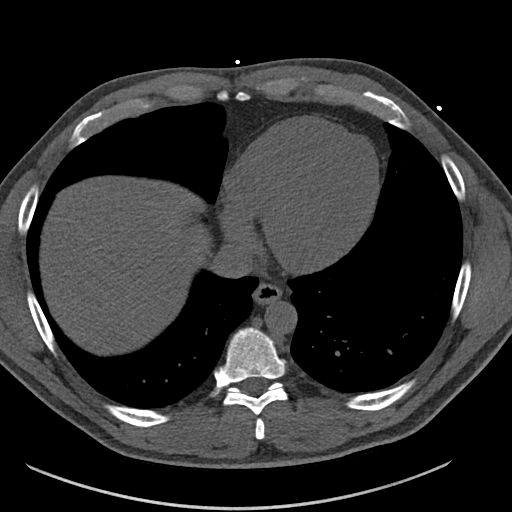
[im 23/46  vessel]
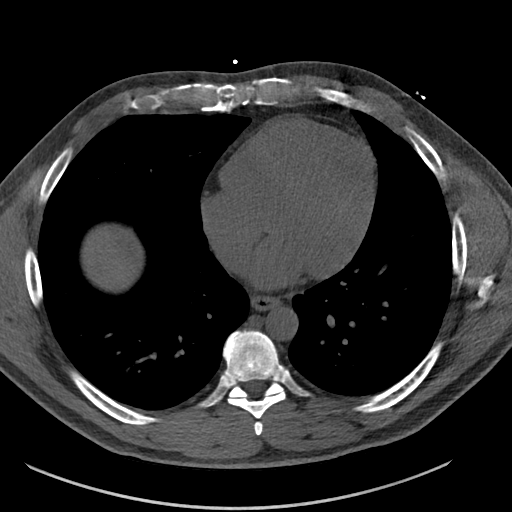
[im 31/46  vessel]
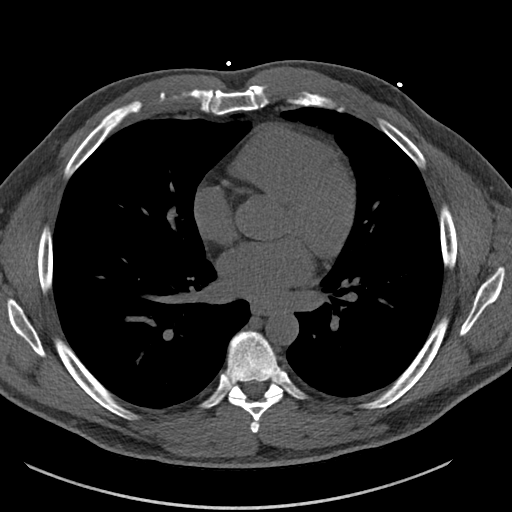
[im 38/46  vessel]
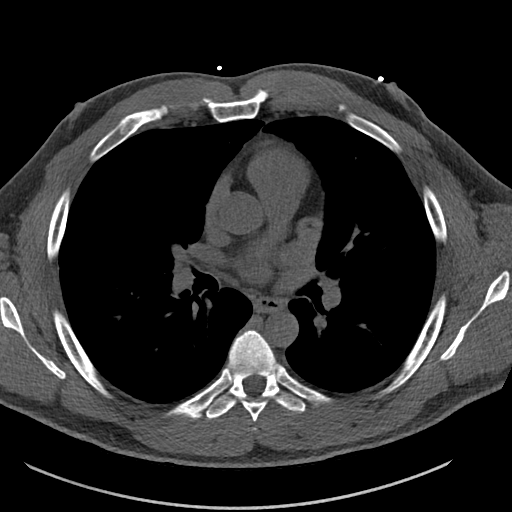
[im 38/46  lung]
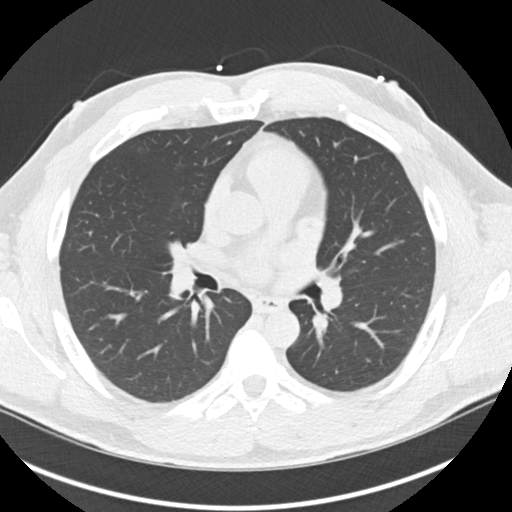

[Series 10: full fov lungs calcium scoring 3.00 ax · axial · 0.70mm/px · z∈[-1116,-1026]mm · 5 of 46 slices shown]
[im 8/46  vessel]
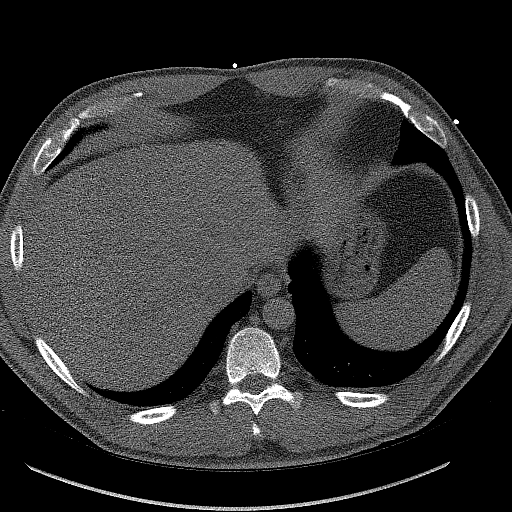
[im 16/46  vessel]
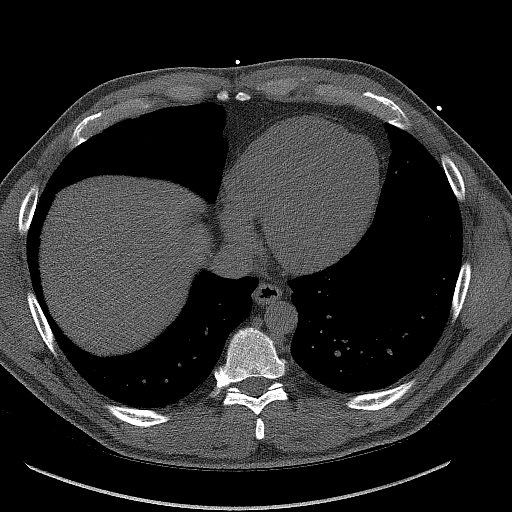
[im 23/46  vessel]
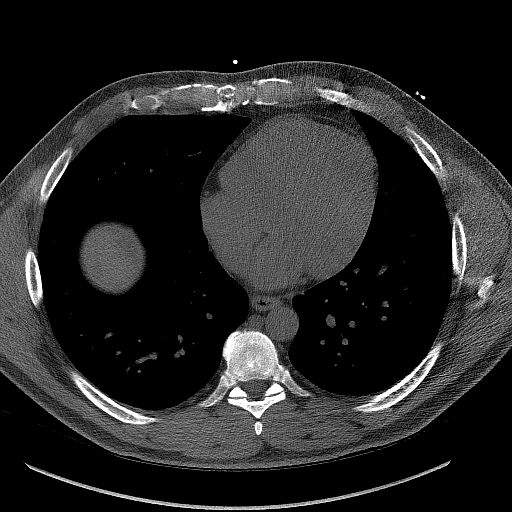
[im 31/46  vessel]
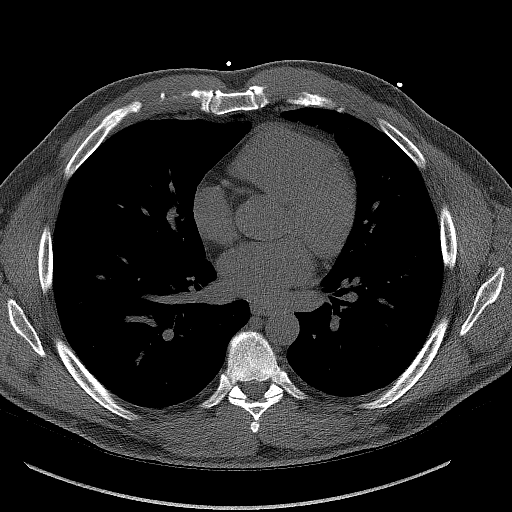
[im 38/46  vessel]
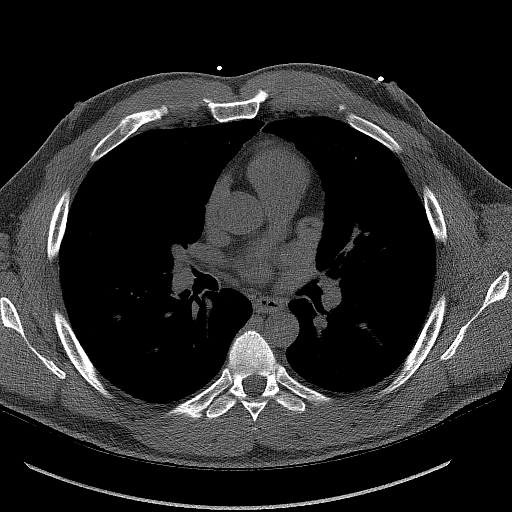

[14 of 20 positions shown; findings below may reference images not displayed]

FINDINGS: Vascular: Aortic atherosclerosis.

Mediastinum/Nodes: No imaged thoracic adenopathy.

Lungs/Pleura: No pleural fluid. 3 mm nodule on the right minor
fissure on [DATE].

Upper Abdomen: Mild right hemidiaphragm elevation. Normal imaged
portions of the spleen, stomach.

Musculoskeletal: No acute osseous abnormality.
IMPRESSION: 1.  No acute findings in the imaged extracardiac chest.
2. Nodule along the right minor fissure is most likely a subpleural
lymph node. No follow-up needed if patient is low-risk. Non-contrast
chest CT can be considered in 12 months if patient is high-risk.
This recommendation follows the consensus statement: Guidelines for
Management of Incidental Pulmonary Nodules Detected on CT Images:
FINDINGS: Non-cardiac: See separate report from [REDACTED].

Ascending Aorta: Normal size

Pericardium: Normal

Coronary arteries: Normal origin of left and right coronary
arteries. Distribution of arterial calcifications if present, as
noted below;

LM 0

LAD 106

LCx 0

RCA 0

Total 106

IMPRESSION AND RECOMMENDATION:
1. Coronary calcium score of 106. This was 84th percentile for age
and sex matched control.

2. CAC 100-299 in LAD.  JAN BENNY JOOST2/N1.

3. Recommend aspirin and statin if no contraindications.

4. Continue heart healthy lifestyle and risk factor modification.

Vicstrong Abercrombie

*** End of Addendum ***
EXAM:
OVER-READ INTERPRETATION  CT CHEST

The following report is an over-read performed by radiologist Dr.
Sukamto Eyang [REDACTED] on 04/11/2021. This over-read
does not include interpretation of cardiac or coronary anatomy or
pathology. The calcium score interpretation by the cardiologist is
attached.
FINDINGS: Vascular: Aortic atherosclerosis.

Mediastinum/Nodes: No imaged thoracic adenopathy.

Lungs/Pleura: No pleural fluid. 3 mm nodule on the right minor
fissure on [DATE].

Upper Abdomen: Mild right hemidiaphragm elevation. Normal imaged
portions of the spleen, stomach.

Musculoskeletal: No acute osseous abnormality.
IMPRESSION: 1.  No acute findings in the imaged extracardiac chest.
2. Nodule along the right minor fissure is most likely a subpleural
lymph node. No follow-up needed if patient is low-risk. Non-contrast
chest CT can be considered in 12 months if patient is high-risk.
This recommendation follows the consensus statement: Guidelines for
Management of Incidental Pulmonary Nodules Detected on CT Images:

## 2022-11-13 ENCOUNTER — Emergency Department
Admission: EM | Admit: 2022-11-13 | Discharge: 2022-11-13 | Disposition: A | Discharge status: Home / Self Care | Payer: Medicaid Other | Attending: Emergency Medicine | Admitting: Emergency Medicine

## 2022-11-13 ENCOUNTER — Other Ambulatory Visit: Payer: Self-pay

## 2022-11-13 ENCOUNTER — Emergency Department: Payer: Medicaid Other

## 2022-11-13 DIAGNOSIS — Z23 Encounter for immunization: Secondary | ICD-10-CM | POA: Diagnosis not present

## 2022-11-13 DIAGNOSIS — S61412A Laceration without foreign body of left hand, initial encounter: Secondary | ICD-10-CM | POA: Insufficient documentation

## 2022-11-13 DIAGNOSIS — W312XXA Contact with powered woodworking and forming machines, initial encounter: Secondary | ICD-10-CM | POA: Diagnosis not present

## 2022-11-13 DIAGNOSIS — S6992XA Unspecified injury of left wrist, hand and finger(s), initial encounter: Secondary | ICD-10-CM | POA: Diagnosis present

## 2022-11-13 MED ORDER — LIDOCAINE-EPINEPHRINE 2 %-1:100000 IJ SOLN
20.0000 mL | Freq: Once | INTRAMUSCULAR | Status: AC
  Administered 2022-11-13: 20 mL

## 2022-11-13 MED ORDER — TETANUS-DIPHTH-ACELL PERTUSSIS 5-2.5-18.5 LF-MCG/0.5 IM SUSY
0.5000 mL | PREFILLED_SYRINGE | Freq: Once | INTRAMUSCULAR | Status: AC
  Administered 2022-11-13: 0.5 mL via INTRAMUSCULAR
  Filled 2022-11-13: qty 0.5

## 2022-11-13 MED ORDER — CEPHALEXIN 500 MG PO CAPS: 500.0000 mg | ORAL_CAPSULE | Freq: Four times a day (QID) | ORAL | 0 refills | Status: AC

## 2022-11-13 NOTE — ED Provider Notes (Signed)
[facility] Provider Note  Patient Contact: 3:34 PM (approximate)   History   Laceration   HPI  Darryl Chaney is a 52 y.o. male presents to the emergency department with a 3 cm laceration along the volar aspect of the left hand sustained with this saw.  Patient cannot recall his last tetanus shot.  He has full range of motion of the left thumb.  No similar injuries in the past.  No numbness or tingling of the left hand.      Physical Exam   Triage Vital Signs: ED Triage Vitals  Encounter Vitals Group     BP 11/13/22 1436 (!) 140/92     Systolic BP Percentile --      Diastolic BP Percentile --      Pulse Rate 11/13/22 1436 86     Resp 11/13/22 1436 18     Temp 11/13/22 1436 98.3 F (36.8 C)     Temp src --      SpO2 11/13/22 1436 93 %     Weight 11/13/22 1441 231 lb 4.2 oz (104.9 kg)     Height 11/13/22 1441 5\' 9"  (1.753 m)     Head Circumference --      Peak Flow --      Pain Score 11/13/22 1435 7     Pain Loc --      Pain Education --      Exclude from Growth Chart --     Most recent vital signs: Vitals:   11/13/22 1436  BP: (!) 140/92  Pulse: 86  Resp: 18  Temp: 98.3 F (36.8 C)  SpO2: 93%     General: Alert and in no acute distress. Eyes:  PERRL. EOMI. Head: No acute traumatic findings ENT:      Nose: No congestion/rhinnorhea.      Mouth/Throat: Mucous membranes are moist. Neck: No stridor. No cervical spine tenderness to palpation. Cardiovascular:  Good peripheral perfusion Respiratory: Normal respiratory effort without tachypnea or retractions. Lungs CTAB. Good air entry to the bases with no decreased or absent breath sounds. Gastrointestinal: Bowel sounds 4 quadrants. Soft and nontender to palpation. No guarding or rigidity. No palpable masses. No distention. No CVA tenderness. Musculoskeletal: Full range of motion to all extremities.  Palpable radial and ulnar pulses bilaterally and symmetrically.  Capillary refill  less than 2 seconds on the left. Neurologic:  No gross focal neurologic deficits are appreciated.  Skin:  Patient has a 3 cm laceration along the volar aspect of the left thumb.     ED Results / Procedures / Treatments   Labs (all labs ordered are listed, but only abnormal results are displayed) Labs Reviewed - No data to display    RADIOLOGY  I personally viewed and evaluated these images as part of my medical decision making, as well as reviewing the written report by the radiologist.  ED Provider Interpretation: No acute bony abnormality on x-ray.   PROCEDURES:  Critical Care performed: No  ..Laceration Repair  Date/Time: 11/13/2022 3:36 PM  Performed by: Orvil Feil, PA-C Authorized by: Orvil Feil, PA-C   Consent:    Consent obtained:  Verbal   Risks discussed:  Infection and pain Universal protocol:    Procedure explained and questions answered to patient or proxy's satisfaction: yes   Anesthesia:    Anesthesia method:  Local infiltration   Local anesthetic:  Lidocaine 2% WITH epi Laceration details:    Location:  Hand  Hand location:  L palm   Length (cm):  3   Depth (mm):  5 Pre-procedure details:    Preparation:  Patient was prepped and draped in usual sterile fashion Exploration:    Limited defect created (wound extended): no     Wound extent: areolar tissue violated     Contaminated: yes   Treatment:    Area cleansed with:  Povidone-iodine   Amount of cleaning:  Standard   Irrigation solution:  Sterile saline   Irrigation volume:  500   Visualized foreign bodies/material removed: no     Debridement:  None Skin repair:    Repair method:  Sutures   Suture size:  4-0   Suture technique:  Running locked   Number of sutures:  7 Approximation:    Approximation:  Close Repair type:    Repair type:  Simple Post-procedure details:    Dressing:  Non-adherent dressing    MEDICATIONS ORDERED IN ED: Medications  lidocaine-EPINEPHrine  (XYLOCAINE W/EPI) 2 %-1:100000 (with pres) injection 20 mL (20 mLs Infiltration Given by Other 11/13/22 1527)  Tdap (BOOSTRIX) injection 0.5 mL (0.5 mLs Intramuscular Given 11/13/22 1555)     IMPRESSION / MDM / ASSESSMENT AND PLAN / ED COURSE  I reviewed the triage vital signs and the nursing notes.                              Assessment and plan Hand laceration:  52 year old male presents to the emergency department with a 3 cm laceration along the volar aspect of the left hand at thenar eminence.  Vital signs are reassuring at triage.  On exam, patient was alert, active and nontoxic-appearing.  Wound was copiously irrigated with normal saline and laceration repair occurred without complication.  Tetanus status updated in the emergency department.  Patient was transition to Keflex 4 times daily for the next 7 days.  Patient education regarding wound care was given and return precautions were given.     FINAL CLINICAL IMPRESSION(S) / ED DIAGNOSES   Final diagnoses:  Laceration of left hand without foreign body, initial encounter     Rx / DC Orders   ED Discharge Orders          Ordered    cephALEXin (KEFLEX) 500 MG capsule  4 times daily        11/13/22 1538             Note:  This document was prepared using Dragon voice recognition software and may include unintentional dictation errors.   Gasper Lloyd 11/13/22 Hortencia Pilar, MD 11/14/22 [phone]

## 2022-11-13 NOTE — ED Triage Notes (Addendum)
Pt comes with c/o laceration to left hand. Pt has bandage in place with bleeding noted thru it. Pt states he cut it on  plainer.  Pt states laceration to palm of hand and finger.

## 2022-11-13 NOTE — ED Notes (Signed)
See triage note  Presents with laceration to left hand  States he cut it with a planer   Laceration from palm to thumb

## 2022-11-13 NOTE — Discharge Instructions (Addendum)
Keep wound clean and dry for the next 24 hours. After 24 hours, you can remove dressing. Start Keflex 4 times a day for the next 7 days. If any redness around wound, please return to the emergency department for reevaluation. You can apply a small amount of Vaseline to the wound once a day except for the last day before sutures are removed. [facility] is a good place to have sutures removed in ten days. Marland Kitchen

## 2022-11-17 NOTE — Progress Notes (Deleted)
There were no vitals taken for this visit.   Subjective:    Patient ID: Darryl Chaney, male    DOB: [birth year], 52 y.o.   MRN: [id number]  HPI: Darryl Chaney is a 52 y.o. male  No chief complaint on file.   Relevant past medical, surgical, family and social history reviewed and updated as indicated. Interim medical history since our last visit reviewed. Allergies and medications reviewed and updated.  Review of Systems  Per HPI unless specifically indicated above     Objective:    There were no vitals taken for this visit.  Wt Readings from Last 3 Encounters:  11/13/22 231 lb 4.2 oz (104.9 kg)  08/12/20 231 lb 4.2 oz (104.9 kg)  06/03/20 230 lb (104.3 kg)    Physical Exam  Results for orders placed or performed during the hospital encounter of 08/12/20  Basic metabolic panel  Result Value Ref Range   Sodium 140 135 - 145 mmol/L   Potassium 3.9 3.5 - 5.1 mmol/L   Chloride 105 98 - 111 mmol/L   CO2 24 22 - 32 mmol/L   Glucose, Bld 106 (H) 70 - 99 mg/dL   BUN 24 (H) 6 - 20 mg/dL   Creatinine, Ser 7.42 (H) 0.61 - 1.24 mg/dL   Calcium 9.5 8.9 - 59.5 mg/dL   GFR, Estimated 46 (L) >60 mL/min   Anion gap 11 5 - 15      Assessment & Plan:   Problem List Items Addressed This Visit   None    Follow up plan: No follow-ups on file.

## 2022-11-18 ENCOUNTER — Ambulatory Visit: Payer: Medicaid Other | Admitting: Pediatrics

## 2022-12-17 ENCOUNTER — Ambulatory Visit: Payer: Medicaid Other | Admitting: Pediatrics

## 2023-03-30 ENCOUNTER — Other Ambulatory Visit: Payer: Self-pay | Admitting: Nurse Practitioner

## 2023-03-30 DIAGNOSIS — R109 Unspecified abdominal pain: Secondary | ICD-10-CM

## 2023-04-14 ENCOUNTER — Ambulatory Visit
Admission: RE | Admit: 2023-04-14 | Discharge: 2023-04-14 | Disposition: A | Discharge status: Home / Self Care | Payer: Medicaid Other | Source: Ambulatory Visit | Attending: Nurse Practitioner | Admitting: Nurse Practitioner

## 2023-04-14 DIAGNOSIS — R109 Unspecified abdominal pain: Secondary | ICD-10-CM

## 2023-05-08 DIAGNOSIS — R059 Cough, unspecified: Secondary | ICD-10-CM | POA: Diagnosis not present

## 2023-07-08 DIAGNOSIS — R0981 Nasal congestion: Secondary | ICD-10-CM | POA: Diagnosis not present

## 2023-08-06 DIAGNOSIS — J310 Chronic rhinitis: Secondary | ICD-10-CM | POA: Diagnosis not present

## 2023-08-06 DIAGNOSIS — J342 Deviated nasal septum: Secondary | ICD-10-CM | POA: Diagnosis not present

## 2023-10-06 DIAGNOSIS — I10 Essential (primary) hypertension: Secondary | ICD-10-CM | POA: Diagnosis not present

## 2023-10-06 DIAGNOSIS — F331 Major depressive disorder, recurrent, moderate: Secondary | ICD-10-CM | POA: Diagnosis not present

## 2023-10-06 DIAGNOSIS — D751 Secondary polycythemia: Secondary | ICD-10-CM | POA: Diagnosis not present

## 2023-10-06 DIAGNOSIS — E230 Hypopituitarism: Secondary | ICD-10-CM | POA: Diagnosis not present

## 2023-10-15 DIAGNOSIS — J029 Acute pharyngitis, unspecified: Secondary | ICD-10-CM | POA: Diagnosis not present

## 2024-01-06 DIAGNOSIS — J301 Allergic rhinitis due to pollen: Secondary | ICD-10-CM | POA: Diagnosis not present

## 2024-01-06 DIAGNOSIS — J342 Deviated nasal septum: Secondary | ICD-10-CM | POA: Diagnosis not present
